# Patient Record
Sex: Male | Born: 1970 | Hispanic: No | Marital: Married | Smoking: Never smoker
Health system: Southern US, Community
[De-identification: ages and names within clinical notes are randomized; demographics above are authoritative.]

## PROBLEM LIST (undated history)

## (undated) VITALS — BP 118/80 | HR 80 | Ht 70.0 in | Wt 196.8 lb

## (undated) DIAGNOSIS — L501 Idiopathic urticaria: Secondary | ICD-10-CM

## (undated) DIAGNOSIS — R7301 Impaired fasting glucose: Secondary | ICD-10-CM

## (undated) DIAGNOSIS — E781 Pure hyperglyceridemia: Secondary | ICD-10-CM

## (undated) DIAGNOSIS — J309 Allergic rhinitis, unspecified: Secondary | ICD-10-CM

## (undated) HISTORY — DX: Allergic rhinitis, unspecified: J30.9

## (undated) HISTORY — DX: Idiopathic urticaria: L50.1

## (undated) HISTORY — DX: Impaired fasting glucose: R73.01

## (undated) HISTORY — DX: Pure hyperglyceridemia: E78.1

---

## 2004-08-12 HISTORY — PX: KNEE ARTHROSCOPY W/ MENISCAL REPAIR: SHX1877

## 2006-02-20 ENCOUNTER — Encounter: Payer: Self-pay | Admitting: Family Medicine

## 2006-02-20 LAB — CONVERTED CEMR LAB: TSH: 2.01 microintl units/mL

## 2006-12-17 ENCOUNTER — Encounter: Admission: RE | Admit: 2006-12-17 | Discharge: 2006-12-17 | Payer: Self-pay | Admitting: Family Medicine

## 2007-02-14 ENCOUNTER — Ambulatory Visit: Payer: Self-pay | Admitting: Vascular Surgery

## 2007-02-14 ENCOUNTER — Emergency Department (HOSPITAL_COMMUNITY): Admission: EM | Admit: 2007-02-14 | Discharge: 2007-02-14 | Payer: Self-pay | Admitting: Emergency Medicine

## 2007-02-14 ENCOUNTER — Encounter (INDEPENDENT_AMBULATORY_CARE_PROVIDER_SITE_OTHER): Payer: Self-pay | Admitting: Emergency Medicine
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
## 2009-08-02 ENCOUNTER — Encounter: Payer: Self-pay | Admitting: Family Medicine

## 2009-08-02 LAB — CONVERTED CEMR LAB
ALT: 23 units/L
AST: 21 units/L
Albumin: 4.5 g/dL
CO2: 28 meq/L
Calcium: 10.1 mg/dL
Chloride: 102 meq/L
Creatinine, Ser: 0.8 mg/dL
HCT: 41.2 %
HDL: 46 mg/dL
LDL Cholesterol: 104 mg/dL
Potassium: 4.8 meq/L
Total Protein: 7.3 g/dL
WBC: 5.4 10*3/uL

## 2010-08-10 ENCOUNTER — Ambulatory Visit: Payer: Self-pay | Admitting: Family Medicine

## 2010-08-10 DIAGNOSIS — E663 Overweight: Secondary | ICD-10-CM | POA: Insufficient documentation

## 2010-08-10 LAB — CONVERTED CEMR LAB
ALT: 44 units/L (ref 0–53)
AST: 29 units/L (ref 0–37)
Albumin: 4 g/dL (ref 3.5–5.2)
Alkaline Phosphatase: 57 units/L (ref 39–117)
Calcium: 9.3 mg/dL (ref 8.4–10.5)
Creatinine, Ser: 0.9 mg/dL (ref 0.4–1.5)
GFR calc non Af Amer: 105.17 mL/min (ref >60.00)
HDL: 35.6 mg/dL — ABNORMAL LOW (ref >39.00)
Sodium: 138 meq/L (ref 135–145)
Total Bilirubin: 0.6 mg/dL (ref 0.3–1.2)
Triglycerides: 193 mg/dL — ABNORMAL HIGH (ref 0.0–149.0)

## 2010-09-09 ENCOUNTER — Encounter: Payer: Self-pay | Admitting: Family Medicine

## 2010-09-13 NOTE — Assessment & Plan Note (Signed)
Summary: NEW PATIENT, EST / LFW   Vital Signs:  Patient profile:   40 year old male Height:      70 inches Weight:      196.75 pounds BMI:     28.33 Temp:     98.1 degrees F oral Pulse rate:   80 / minute Pulse rhythm:   regular BP sitting:   118 / 80  (left arm) Cuff size:   large  Vitals Entered By: Selena Batten Dance CMA Duncan Dull) (August 10, 2010 9:27 AM) CC: New patient to establish care   History of Present Illness: CC: CPE, new patient  last CPE was last year (05/2009).  Blood work done then, told cholesterol and sugar were fine.  Saw [facility].  Diet - good amt fruits and vegetables.  drinks water. Activity - treadmill 2-3x/wk for 30 min at home walking.  Preventative: flu shot - would like but we're out.  rec go to pharmacy Tetanus 2003. blood work was done last year.  thinks all normal.   Current Medications (verified): 1)  Centrum Ultra Mens  Tabs (Multiple Vitamins-Minerals) .... Take One Daily  Allergies (verified): No Known Drug Allergies  Past History:  Past Medical History: BMI 28  Past Surgical History: R meniscal tear repair 2006  Family History: M: HLD  No CA, CAD/MI, CVA, HTN, DM  Social History: No smoking, social EtOH, no rec drugs caffeine: 2 cups tea or coffee/day.  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    Physical Exam  General:  Well-developed,well-nourished,in no acute distress; alert,appropriate and cooperative throughout examination Head:  Normocephalic and atraumatic without  obvious abnormalities. No apparent alopecia or balding. Eyes:  No corneal or conjunctival inflammation noted. EOMI. Perrla. Ears:  TMs clear bilaterally Nose:  nares clear Mouth:  MMM, no pharyngeal erythema/edema/exudates Neck:  No deformities, masses, or tenderness noted.  no LAD Lungs:  Normal respiratory effort, chest expands symmetrically. Lungs are clear to auscultation, no crackles or wheezes. Heart:  Normal rate and regular rhythm. S1 and S2 normal without gallop, murmur, click, rub or other extra sounds. Abdomen:  Bowel sounds positive,abdomen soft and non-tender without masses, organomegaly or hernias noted. Msk:  No deformity or scoliosis noted of thoracic or lumbar spine.   Pulses:  2+ rad pulses, brisk cap refill Extremities:  no pedal edema Neurologic:  CN grossly intact, station and gait intact Skin:  Intact without suspicious lesions or rashes Psych:  full affect, pleasant and cooperative with exam   Impression & Recommendations:  Problem # 1:  HEALTH MAINTENANCE EXAM (ICD-V70.0) Reviewed preventive care protocols, scheduled due services, and updated immunizations.  utd tetanus.  request records from [place].  rec go to pharm to get flu shot.  dsicussed healthy eating and healthy living with increased exercise for overall health.  Orders: TLB-BMP (Basic Metabolic Panel-BMET) (80048-METABOL) TLB-Lipid Panel (80061-LIPID) TLB-Hepatic/Liver Function Pnl (80076-HEPATIC)  Problem # 2:  OVERWEIGHT (ICD-278.02) discussed ideal body weight  Ht: 70 (08/10/2010)   Wt: 196.75 (08/10/2010)   BMI: 28.33 (08/10/2010)  Complete Medication List: 1)  Centrum Ultra Mens Tabs (Multiple vitamins-minerals) .... Take one daily  Patient Instructions: 1)  Return in  1-2 years for next CPE or as needed. 2)  blood work today to check sugar and cholesterol. 3)  sign release of record from [place] (mainly for blood work there). 4)  good to meet you today.  call clinic with  questions.   Orders Added: 1)  TLB-BMP (Basic Metabolic Panel-BMET) [80048-METABOL] 2)  TLB-Lipid Panel [80061-LIPID] 3)  TLB-Hepatic/Liver Function Pnl [80076-HEPATIC] 4)  New Patient 18-39 years [99385]    Prior Medications: Current Allergies (reviewed today): No known allergies

## 2011-03-12 ENCOUNTER — Encounter: Payer: Self-pay | Admitting: Family Medicine

## 2011-03-13 ENCOUNTER — Ambulatory Visit (INDEPENDENT_AMBULATORY_CARE_PROVIDER_SITE_OTHER): Payer: 59 | Admitting: Family Medicine

## 2011-03-13 ENCOUNTER — Encounter: Payer: Self-pay | Admitting: Family Medicine

## 2011-03-13 VITALS — BP 122/78 | HR 72 | Temp 97.5°F | Ht 71.0 in | Wt 177.8 lb

## 2011-03-13 DIAGNOSIS — K1370 Unspecified lesions of oral mucosa: Secondary | ICD-10-CM | POA: Insufficient documentation

## 2011-03-13 NOTE — Patient Instructions (Addendum)
Look like apthous ulcer (canker sores) on tongue or could be herpes infection (virus responsible for cold sores). Symptomatic treatment for now - cold things like popsicles or warm things like herbal teas - continue dental hygiene brushing teeth 2x/day as well as floss.  listerine mouth wash.   Update [place] if fevers, joint pains, or worsening instead of improving. If not improving as expected, let me know and I could send in antiviral.

## 2011-03-13 NOTE — Assessment & Plan Note (Signed)
On tongue.  No systemic sxs. Going on 7+ days. 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   Treat supportively for now, if not improving, to call us for antiviral.

## 2011-03-13 NOTE — Progress Notes (Signed)
  Subjective:    Patient ID: Rowland Ericsson, male    DOB: [birth year], 40 y.o.   MRN: [id number]  HPI CC: blisters on tongue  1 wk h/o several blisters on tongue, one underneath tongue.  Somewhat painful.  Never had blisters like this before.  Has had cold sores in past.  Hasn't had any new medicines, foods, no recent dental procedures.  Endorsing salty taste in mouth.  No sick contacts at home.  No fevers/chills, other rashes, abd pain, nausea, vomiting.  No red eye, no joint pains.  Review of Systems Per HPI    Objective:   Physical Exam AFVSS NAD HEENT: PERRLA, EOMI, no conjunctival injection, MMM, old lesion right lateral tongue, healed.  New lesion left base of tongue, pustule with erythematous border, about 5mm diameter, tender      Assessment & Plan:

## 2011-08-23 ENCOUNTER — Ambulatory Visit (INDEPENDENT_AMBULATORY_CARE_PROVIDER_SITE_OTHER): Payer: 59 | Admitting: Family Medicine

## 2011-08-23 ENCOUNTER — Encounter: Payer: Self-pay | Admitting: Family Medicine

## 2011-08-23 ENCOUNTER — Telehealth: Payer: Self-pay | Admitting: Internal Medicine

## 2011-08-23 VITALS — BP 114/78 | HR 84 | Temp 99.1°F | Wt 180.0 lb

## 2011-08-23 DIAGNOSIS — J029 Acute pharyngitis, unspecified: Secondary | ICD-10-CM

## 2011-08-23 DIAGNOSIS — R509 Fever, unspecified: Secondary | ICD-10-CM | POA: Insufficient documentation

## 2011-08-23 LAB — POCT RAPID STREP A (OFFICE): Rapid Strep A Screen: NEGATIVE

## 2011-08-23 MED ORDER — ONDANSETRON HCL 4 MG PO TABS: 4.0000 mg | ORAL_TABLET | Freq: Three times a day (TID) | ORAL | Status: DC | PRN

## 2011-08-23 NOTE — Assessment & Plan Note (Addendum)
Likely viral, unlikely to be flu as he is well appearing and in nad, not sweaty.  Ddx d/w pt.  tamiflu unlikely to be of sig benefit.  He agrees.  Supportive tx, fluids and rest, zofran prn for nav.  Okay for outpatient f/u. F/u prn.  He agrees. I don't suspect a primary abdominal process given the total lack of tenderness on abd exam.

## 2011-08-23 NOTE — Patient Instructions (Signed)
Get some rest, take zofran for vomiting, drink sips of fluids.  This should gradually improve. Take tylenol for fever (most likely to be needed late afternoon, at night).

## 2011-08-23 NOTE — Progress Notes (Signed)
duration of symptoms: last 2 days Rhinorrhea: yes congestion:yes ear pain:no sore throat:yes Cough: yes, some sputum myalgias:minimal other concerns: fever 102.5 last night with some vomiting and shivering.  HA per patient, frontal Daughter has a cough.  Usually works from home.  No diarrhea.  No nausea now.   ROS: See HPI.  Otherwise negative.    Meds, vitals, and allergies reviewed.   GEN: nad, alert and oriented HEENT: mucous membranes moist, TM w/o erythema, nasal epithelium injected and stuffy, OP with cobblestoning but no exudates NECK: supple w/1 small and mildly tender node on R side, no other LA noted CV: rrr. PULM: ctab, no inc wob ABD: soft, +bs, no masses, not ttp EXT: no edema  RST neg

## 2011-08-29 ENCOUNTER — Ambulatory Visit (INDEPENDENT_AMBULATORY_CARE_PROVIDER_SITE_OTHER): Payer: 59 | Admitting: Family Medicine

## 2011-08-29 ENCOUNTER — Encounter: Payer: Self-pay | Admitting: Family Medicine

## 2011-08-29 VITALS — BP 102/82 | HR 69 | Temp 97.8°F | Wt 179.2 lb

## 2011-08-29 DIAGNOSIS — M62838 Other muscle spasm: Secondary | ICD-10-CM | POA: Insufficient documentation

## 2011-08-29 MED ORDER — CYCLOBENZAPRINE HCL 5 MG PO TABS: 5.0000 mg | ORAL_TABLET | Freq: Every evening | ORAL | Status: AC | PRN

## 2011-08-29 NOTE — Progress Notes (Signed)
  Subjective:    Patient ID: Trevor Hansen, male    DOB: 07-Dec-1970, 41 y.o.   MRN: [id number]  HPI  41 yo here for neck pain.  Had ILI last week, with body aches, fever and cough.  Those symptoms have all resolved but now has left sided neck pain that hurts most when he turns his head to the left side.  No nausea, no photophobia, has FROM without difficulty.  No fevers.  Patient Active Problem List  Diagnoses  . OVERWEIGHT  . Oral lesion  . Fever   No past medical history on file. Past Surgical History  Procedure Date  . Meniscal tear repair 2006    Right   History  Substance Use Topics  . Smoking status: Never Smoker   . Smokeless tobacco: Not on file  . Alcohol Use: Yes     Social   Family History  Problem Relation Age of Onset  . Hyperlipidemia Mother    No Known Allergies Current Outpatient Prescriptions on File Prior to Visit  Medication Sig Dispense Refill  . Multiple Vitamin (MULTIVITAMIN) tablet Take 1 tablet by mouth daily.         The PMH, PSH, Social History, Family History, Medications, and allergies have been reviewed in [facility], and have been updated if relevant.    Review of Systems See HPI     Objective:   Physical Exam BP 102/82  Pulse 69  Temp(Src) 97.8 F (36.6 C) (Oral)  Wt 179 lb 4 oz (81.307 kg) Gen:  Alert, pleasant, NAD HEENT:  FROM without difficulty, can touch chin to chest without any pain or difficulty. Pos left trap spasm Resp:  CTA bilaterally CVS:  RRR     Assessment & Plan:   1. Trapezius muscle spasm    New. Reassurance provided- he was concerned about meninigitis which he has no clinical signs and exam very reassuring. Advised heat and as needed nightly muscle relaxants. The patient indicates understanding of these issues and agrees with the plan.

## 2011-08-29 NOTE — Patient Instructions (Signed)
You have a muscle spasm likely from coughing and your recent flu illness. Keep applying heating pad, use muscle relaxants as directed.

## 2011-09-13 ENCOUNTER — Ambulatory Visit (INDEPENDENT_AMBULATORY_CARE_PROVIDER_SITE_OTHER): Payer: 59 | Admitting: Family Medicine

## 2011-09-13 ENCOUNTER — Encounter: Payer: Self-pay | Admitting: Family Medicine

## 2011-09-13 VITALS — BP 116/82 | HR 80 | Temp 98.3°F | Ht 71.0 in | Wt 181.0 lb

## 2011-09-13 DIAGNOSIS — E781 Pure hyperglyceridemia: Secondary | ICD-10-CM

## 2011-09-13 DIAGNOSIS — E785 Hyperlipidemia, unspecified: Secondary | ICD-10-CM | POA: Insufficient documentation

## 2011-09-13 DIAGNOSIS — Z1211 Encounter for screening for malignant neoplasm of colon: Secondary | ICD-10-CM | POA: Insufficient documentation

## 2011-09-13 DIAGNOSIS — R7301 Impaired fasting glucose: Secondary | ICD-10-CM | POA: Insufficient documentation

## 2011-09-13 DIAGNOSIS — Z0000 Encounter for general adult medical examination without abnormal findings: Secondary | ICD-10-CM | POA: Insufficient documentation

## 2011-09-13 DIAGNOSIS — Z0001 Encounter for general adult medical examination with abnormal findings: Secondary | ICD-10-CM | POA: Insufficient documentation

## 2011-09-13 DIAGNOSIS — Z23 Encounter for immunization: Secondary | ICD-10-CM

## 2011-09-13 LAB — LIPID PANEL
Cholesterol: 145 mg/dL (ref 0–200)
HDL: 39.8 mg/dL (ref >39.00)
LDL Cholesterol: 75 mg/dL (ref 0–99)
Triglycerides: 153 mg/dL — ABNORMAL HIGH (ref 0.0–149.0)
VLDL: 30.6 mg/dL (ref 0.0–40.0)

## 2011-09-13 LAB — BASIC METABOLIC PANEL
BUN: 13 mg/dL (ref 6–23)
Calcium: 9.1 mg/dL (ref 8.4–10.5)
Creatinine, Ser: 0.9 mg/dL (ref 0.4–1.5)
GFR: 104.58 mL/min (ref >60.00)
Glucose, Bld: 104 mg/dL — ABNORMAL HIGH (ref 70–99)
Potassium: 4.3 mEq/L (ref 3.5–5.1)

## 2011-09-13 LAB — HEMOGLOBIN A1C: Hgb A1c MFr Bld: 5.6 % (ref 4.6–6.5)

## 2011-09-13 NOTE — Progress Notes (Signed)
Addended by: Josph Macho A on: 09/13/2011 09:12 AM   Modules accepted: Orders

## 2011-09-13 NOTE — Assessment & Plan Note (Signed)
Chronic. Mild.  Discussed less sugar.

## 2011-09-13 NOTE — Assessment & Plan Note (Signed)
Check A1c today.  Discussed limiting carbs.

## 2011-09-13 NOTE — Assessment & Plan Note (Signed)
Reviewed preventative protocols and updated unless pt declined. Reviewed last year's blood work. This year check labs as fasting.

## 2011-09-13 NOTE — Patient Instructions (Addendum)
Good to see you today, call us with questions.  bloodwork today. Tdap today and flu shot today. Decrease added sugars, eliminate trans fats, increase fiber and limit alcohol.  All these changes together can drop triglycerides by almost 50%. Return in 1-2 years for next physical, sooner if needed.

## 2011-09-13 NOTE — Progress Notes (Addendum)
Subjective:    Patient ID: Trevor Hansen, Trevor Hansen    DOB: 1971-01-19, 41 y.o.   MRN: [id number]  HPI CC: CPE  No questions or concerns today. Wt Readings from Last 3 Encounters:  09/13/11 181 lb (82.101 kg)  08/29/11 179 lb 4 oz (81.307 kg)  08/23/11 180 lb (81.647 kg)  Body mass index is 25.24 kg/(m^2).  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 Tetanus 2003 - rpt today.  Tdap Reviewed last year's blood work, to get blood work today. Seat belt 100% time, sunscreen use discussed  Medications and allergies reviewed and updated in chart.  Past histories reviewed and updated if relevant as below. Patient Active Problem List  Diagnoses  . OVERWEIGHT  . Oral lesion  . Fever  . Trapezius muscle spasm  . Hypertriglyceridemia   Past Medical History  Diagnosis Date  . Hypertriglyceridemia     mild  . Elevated fasting blood sugar    Past Surgical History  Procedure Date  . Meniscal tear repair 2006    Right   History  Substance Use Topics  . Smoking status: Never Smoker   . Smokeless tobacco: Never Used  . Alcohol Use: Yes     Social   Family History  Problem Relation Age of Onset  . Hyperlipidemia Mother   . Coronary artery disease Maternal Aunt   . Coronary artery disease Maternal Uncle 62  . Cancer Maternal Grandmother     bone cancer  . Stroke Neg Hx   . Diabetes Neg Hx    No Known Allergies Current Outpatient Prescriptions on File Prior to Visit  Medication Sig Dispense Refill  . Multiple Vitamin (MULTIVITAMIN) tablet Take 1 tablet by mouth daily.          Review of Systems  Constitutional: Negative for fever, chills, activity change, appetite change, fatigue and unexpected weight  change.  HENT: Negative for hearing loss and neck pain.   Eyes: Negative for visual disturbance.  Respiratory: Negative for cough, chest tightness, shortness of breath and wheezing.   Cardiovascular: Negative for chest pain, palpitations and leg swelling.  Gastrointestinal: Negative for nausea, vomiting, abdominal pain, diarrhea, constipation, blood in stool and abdominal distention.  Genitourinary: Negative for hematuria and difficulty urinating.  Musculoskeletal: Negative for myalgias and arthralgias.  Skin: Negative for rash.  Neurological: Negative for dizziness, seizures, syncope and headaches.  Hematological: Does not bruise/bleed easily.  Psychiatric/Behavioral: Negative for dysphoric mood. The patient is not nervous/anxious.        Objective:   Physical Exam  Nursing note and vitals reviewed. Constitutional: He is oriented to person, place, and time. He appears well-developed and well-nourished. No distress.  HENT:  Head: Normocephalic and atraumatic.  Right Ear: External ear normal.  Left Ear: External ear normal.  Nose: Nose normal.  Mouth/Throat: Oropharynx is clear and moist. No oropharyngeal exudate.  Eyes: Conjunctivae and EOM are normal. Pupils are equal, round, and reactive to light. No scleral icterus.  Neck: Normal range of motion. Neck supple. No thyromegaly present.  Cardiovascular: Normal rate, regular rhythm, normal heart sounds and intact distal pulses.   No murmur heard. Pulses:      Radial pulses are 2+ on the right side, and 2+ on the left side.  Pulmonary/Chest:  Effort normal and breath sounds normal. No respiratory distress. He has no wheezes. He has no rales.  Abdominal: Soft. Bowel sounds are normal. He exhibits no distension and no mass. There is no tenderness. There is no rebound and no guarding.  Musculoskeletal: Normal range of motion.       Sl clubbing  Lymphadenopathy:    He has no cervical adenopathy.  Neurological: He is alert and oriented to  person, place, and time.       CN grossly intact, station and gait intact  Skin: Skin is warm and dry. No rash noted.  Psychiatric: He has a normal mood and affect. His behavior is normal. Judgment and thought content normal.      Assessment & Plan:

## 2011-12-05 ENCOUNTER — Other Ambulatory Visit: Payer: Self-pay

## 2011-12-05 MED ORDER — MOMETASONE FUROATE 50 MCG/ACT NA SUSP: NASAL | Status: DC

## 2011-12-05 NOTE — Telephone Encounter (Signed)
Pt request Nasonex sent to [facility]. Was not on med list but pt requested added because he uses prn. Pt said he forgot to mention when seen 09/13/11. Pt states runny nose,watery eyes, no fever and no cough.Pt can be reached [phone].

## 2011-12-05 NOTE — Telephone Encounter (Signed)
plz notify sent in.  See if has used flonase in past (in case PA needed)

## 2011-12-06 NOTE — Telephone Encounter (Signed)
Patient notified. He has used flonase before, but pick up Nasonex yesterday with no issues.

## 2013-02-01 ENCOUNTER — Encounter: Payer: Self-pay | Admitting: Family Medicine

## 2013-02-01 ENCOUNTER — Ambulatory Visit (INDEPENDENT_AMBULATORY_CARE_PROVIDER_SITE_OTHER): Payer: 59 | Admitting: Family Medicine

## 2013-02-01 VITALS — BP 112/70 | HR 80 | Temp 98.0°F | Wt 187.8 lb

## 2013-02-01 DIAGNOSIS — L509 Urticaria, unspecified: Secondary | ICD-10-CM | POA: Insufficient documentation

## 2013-02-01 NOTE — Progress Notes (Signed)
  Subjective:    Patient ID: Trevor Hansen, male    DOB: Mar 02, 1971, 42 y.o.   MRN: [id number]  CC: possible hives  HPI Patient presents with 2 episodes of hives that have occurred in the past two weeks. He noticed the rash last Monday and Thursday.  Both times the rash occurred around 2 am.   He states that it was itchy, but not sure if it spread when he scratched.  Took Zyrtec when the rash occurred, provided some relief.  After taking the Zyrtec, the rash stopped itching after about an hr and was completely gone when he woke up in the morning.  Has never had hives in the past and his only known allergy is pollen.  He has not started taking any new medications, using different detergents or soaps, and no new foods in the diet.  Does not have any pets at home.  Denies recent illness.   Review of Systems  Constitutional: Negative for fever, chills and diaphoresis.  HENT: Negative for facial swelling, mouth sores and trouble swallowing.   Respiratory: Negative for choking, chest tightness and shortness of breath.   Gastrointestinal: Negative for nausea, vomiting, abdominal pain and diarrhea.  Musculoskeletal: Negative for arthralgias.  Skin: Positive for rash (from trunk to knees).  Allergic/Immunologic: Positive for environmental allergies (pollen). Negative for food allergies and immunocompromised state.       Objective:   Physical Exam  Constitutional: He is oriented to person, place, and time. He appears well-developed and well-nourished. No distress.  HENT:  Head: Normocephalic.  Mouth/Throat: Oropharynx is clear and moist and mucous membranes are normal. No oropharyngeal exudate.  Eyes: Pupils are equal, round, and reactive to light.  Cardiovascular: Normal rate, regular rhythm and normal heart sounds.   Pulmonary/Chest: Effort normal and breath sounds normal. No respiratory distress.  Abdominal: Soft. Bowel sounds are normal. He exhibits no distension and no mass.  Neurological:  He is alert and oriented to person, place, and time.  Skin: Skin is warm. No rash noted. No erythema. No pallor.          Assessment & Plan:  Urticaria -keep a diary the next time this rash occurs and try to identify everything eaten or in contact with a few hours proceeding the rash -continue Zyrtec daily for the next 2 weeks, see if rash continues to occur even while taking the medication -call with an update 2 weeks from now or before if rash worsens or new symptoms develop  -consider referral to allergist if rash continues without an identifiable cause

## 2013-02-01 NOTE — Progress Notes (Signed)
Pt seen and examined with PA student Anna Genre  CC: hives?  Pt would like to review recent blood work from work health program - low glu at 60, elevated spgr otherwise normal.  Asked to scan.  Also presents with concern for possible hives for the last 2 weeks which occurs from lower abdomen to knees, spared arms, hands, feet, face.  Pruritic.  Not currently present.  Has had 2 separate episodes, both at 2am.  Has never had anything like this in past.  Once occurred after swimming.  Brings a picture on phone consistent with hives.  No exercise prior to second rash  So far has tried zyrtec which helps.   Rash occurs at 2am.   No new meds, no new detergents/soaps, no new foods.  No pets.  No bug bites. No recent viral infections, fevers/chills, chest tightness, SOB, cramping abd pain, nausea, tongue/lip swelling, arthralgias. Not heat or cold related.  Only allergy is pollen.  PE:  GEN: WDWN indian male NAD HEENT: MMM, no oral lesions CVS: nl S1/S2, no m/r/g Pulm: CTAB, no c/w Skin: no rash present today

## 2013-02-01 NOTE — Patient Instructions (Addendum)
Take zyrtec daily. Keep diary prior to hives. Update me on how your'e doing. We may refer to allergist for further evaluation if persistent or worsening.  Hives Hives are itchy, red, swollen areas of the skin. They can vary in size and location on your body. Hives can come and go for hours or several days (acute hives) or for several weeks (chronic hives). Hives do not spread from person to person (noncontagious). They may get worse with scratching, exercise, and emotional stress. CAUSES   Allergic reaction to food, additives, or drugs.  Infections, including the common cold.  Illness, such as vasculitis, lupus, or thyroid disease.  Exposure to sunlight, heat, or cold.  Exercise.  Stress.  Contact with chemicals. SYMPTOMS   Red or white swollen patches on the skin. The patches may change size, shape, and location quickly and repeatedly.  Itching.  Swelling of the hands, feet, and face. This may occur if hives develop deeper in the skin. DIAGNOSIS  Your caregiver can usually tell what is wrong by performing a physical exam. Skin or blood tests may also be done to determine the cause of your hives. In some cases, the cause cannot be determined. TREATMENT  Mild cases usually get better with medicines such as antihistamines. Severe cases may require an emergency epinephrine injection. If the cause of your hives is known, treatment includes avoiding that trigger.  HOME CARE INSTRUCTIONS   Avoid causes that trigger your hives.  Take antihistamines as directed by your caregiver to reduce the severity of your hives. Non-sedating or low-sedating antihistamines are usually recommended. Do not drive while taking an antihistamine.  Take any other medicines prescribed for itching as directed by your caregiver.  Wear loose-fitting clothing.  Keep all follow-up appointments as directed by your caregiver. SEEK MEDICAL CARE IF:   You have persistent or severe itching that is not relieved  with medicine.  You have painful or swollen joints. SEEK IMMEDIATE MEDICAL CARE IF:   You have a fever.  Your tongue or lips are swollen.  You have trouble breathing or swallowing.  You feel tightness in the throat or chest.  You have abdominal pain. These problems may be the first sign of a life-threatening allergic reaction. Call your local emergency services (911 in [place] MAKE SURE YOU:   Understand these instructions.  Will watch your condition.  Will get help right away if you are not doing well or get worse. Document Released: 07/29/2005 Document Revised: 01/28/2012 Document Reviewed: 10/22/2011 [facility] Patient Information 2014 [place], [place].

## 2013-02-01 NOTE — Assessment & Plan Note (Signed)
Unclear etiology - no triggers found. Recommend start zyrtec daily for 2 weeks and monitor for hives.   Keep diary a few hours prior to next episode to see if any consistent exposure triggering urticaria.

## 2013-02-04 ENCOUNTER — Encounter: Payer: Self-pay | Admitting: Family Medicine

## 2013-05-29 ENCOUNTER — Other Ambulatory Visit: Payer: Self-pay | Admitting: Family Medicine

## 2013-05-29 DIAGNOSIS — E781 Pure hyperglyceridemia: Secondary | ICD-10-CM

## 2013-05-29 DIAGNOSIS — R7301 Impaired fasting glucose: Secondary | ICD-10-CM

## 2013-06-01 ENCOUNTER — Other Ambulatory Visit (INDEPENDENT_AMBULATORY_CARE_PROVIDER_SITE_OTHER): Payer: 59

## 2013-06-01 DIAGNOSIS — E781 Pure hyperglyceridemia: Secondary | ICD-10-CM

## 2013-06-01 DIAGNOSIS — R7301 Impaired fasting glucose: Secondary | ICD-10-CM

## 2013-06-01 LAB — LIPID PANEL
Cholesterol: 146 mg/dL (ref 0–200)
LDL Cholesterol: 77 mg/dL (ref 0–99)
Triglycerides: 160 mg/dL — ABNORMAL HIGH (ref 0.0–149.0)

## 2013-06-01 LAB — BASIC METABOLIC PANEL
BUN: 13 mg/dL (ref 6–23)
Calcium: 9.3 mg/dL (ref 8.4–10.5)
Chloride: 103 mEq/L (ref 96–112)
Creatinine, Ser: 0.9 mg/dL (ref 0.4–1.5)

## 2013-06-10 ENCOUNTER — Encounter: Payer: Self-pay | Admitting: Family Medicine

## 2013-06-10 ENCOUNTER — Ambulatory Visit (INDEPENDENT_AMBULATORY_CARE_PROVIDER_SITE_OTHER): Payer: 59 | Admitting: Family Medicine

## 2013-06-10 VITALS — BP 130/84 | HR 91 | Temp 99.2°F | Ht 68.75 in | Wt 193.5 lb

## 2013-06-10 DIAGNOSIS — E663 Overweight: Secondary | ICD-10-CM | POA: Insufficient documentation

## 2013-06-10 DIAGNOSIS — R7301 Impaired fasting glucose: Secondary | ICD-10-CM

## 2013-06-10 DIAGNOSIS — Z0000 Encounter for general adult medical examination without abnormal findings: Secondary | ICD-10-CM

## 2013-06-10 DIAGNOSIS — E781 Pure hyperglyceridemia: Secondary | ICD-10-CM

## 2013-06-10 DIAGNOSIS — L509 Urticaria, unspecified: Secondary | ICD-10-CM

## 2013-06-10 DIAGNOSIS — Z23 Encounter for immunization: Secondary | ICD-10-CM

## 2013-06-10 DIAGNOSIS — R300 Dysuria: Secondary | ICD-10-CM | POA: Insufficient documentation

## 2013-06-10 LAB — POCT URINALYSIS DIPSTICK
Bilirubin, UA: NEGATIVE
Glucose, UA: NEGATIVE
Nitrite, UA: NEGATIVE
Spec Grav, UA: 1.01
Urobilinogen, UA: 0.2

## 2013-06-10 NOTE — Assessment & Plan Note (Signed)
Lab Results  Component Value Date   HGBA1C 5.6 09/13/2011  reviewed elevated sugar again - consider rechecking A1c if persistnetly elevated.

## 2013-06-10 NOTE — Assessment & Plan Note (Signed)
Preventative protocols reviewed and updated unless pt declined. Discussed healthy diet and lifestyle.  Weight gain noted - discussed

## 2013-06-10 NOTE — Assessment & Plan Note (Signed)
Minimal off meds - continue to monitor. 

## 2013-06-10 NOTE — Assessment & Plan Note (Signed)
Body mass index is 28.79 kg/(m^2).  Discussed weight gain noted over last year - encouraged him to increase regular exercise daily.

## 2013-06-10 NOTE — Progress Notes (Signed)
Subjective:    Patient ID: Trevor Hansen, male    DOB: 07/12/1971, 41 y.o.   MRN: [id number]  HPI CC: CPE  Having some issues over the last month. Persistent hives - zyrtec clears hives but then they return. Taking zyrtec QOD. Hives come on at night - wakes up itching - start in legs and progress to stomach. No known allergies to foods or medicines.  No change in detergent or shampoo or soaps. Not temperature related.  No new foods.  Over the last month noticing mild dysuria.  This happened for several months but not in the last week.  Noticing sharp R sided pain that lasts 2-3 minutes.   No frequency, urgency, hematuria.  No abd pain, fevers/chills, nausea.  Some fatigue with this.  Wt Readings from Last 3 Encounters:  06/10/13 193 lb 8 oz (87.771 kg)  02/01/13 187 lb 12 oz (85.163 kg)  09/13/11 181 lb (82.101 kg)    Drinking good water.  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  Past histories reviewed and updated if relevant as below. Patient Active Problem List   Diagnosis Date Noted  . Urticaria 02/01/2013  . Hypertriglyceridemia 09/13/2011  . Health maintenance examination 09/13/2011  . Impaired fasting blood sugar 09/13/2011   Past Medical History  Diagnosis Date  . Hypertriglyceridemia     mild  . Elevated fasting blood sugar    Past Surgical History  Procedure Laterality Date  . Meniscal tear repair  2006    Right   History  Substance Use Topics  . Smoking status: Never Smoker   . Smokeless tobacco: Never Used  . Alcohol Use: Yes     Comment: Social   Family  History  Problem Relation Age of Onset  . Hyperlipidemia Mother   . Coronary artery disease Maternal Aunt   . Coronary artery disease Maternal Uncle 62  . Cancer Maternal Grandmother     bone cancer  . Stroke Neg Hx   . Diabetes Neg Hx    No Known Allergies Current Outpatient Prescriptions on File Prior to Visit  Medication Sig Dispense Refill  . cetirizine (ZYRTEC) 10 MG tablet Take 10 mg by mouth daily as needed for allergies.      . mometasone (NASONEX) 50 MCG/ACT nasal spray One spray each nostril daily as needed.  17 g  6  . Multiple Vitamin (MULTIVITAMIN) tablet Take 1 tablet by mouth daily.         No current facility-administered medications on file prior to visit.     Review of Systems  Constitutional: Negative for fever, chills, activity change, appetite change, fatigue and unexpected weight change.  HENT: Negative for hearing loss.   Eyes: Negative for visual disturbance.  Respiratory: Negative for cough, chest tightness, shortness of breath and wheezing.   Cardiovascular: Negative for chest pain, palpitations and leg swelling.  Gastrointestinal: Negative for nausea, vomiting, abdominal pain, diarrhea, constipation, blood in stool and abdominal distention.  Genitourinary: Positive for dysuria (see HPI). Negative for hematuria and difficulty urinating.  Musculoskeletal: Negative for arthralgias, myalgias and neck pain.  Skin: Negative for rash.  Neurological:  Negative for dizziness, seizures, syncope and headaches.  Hematological: Negative for adenopathy. Does not bruise/bleed easily.  Psychiatric/Behavioral: Negative for dysphoric mood. The patient is not nervous/anxious.        Objective:   Physical Exam  Nursing note and vitals reviewed. Constitutional: He is oriented to person, place, and time. He appears well-developed and well-nourished. No distress.  HENT:  Head: Normocephalic and atraumatic.  Right Ear: Hearing, tympanic membrane, external ear and ear canal  normal.  Left Ear: Hearing, tympanic membrane, external ear and ear canal normal.  Nose: Nose normal.  Mouth/Throat: Oropharynx is clear and moist. No oropharyngeal exudate.  Eyes: Conjunctivae and EOM are normal. Pupils are equal, round, and reactive to light. No scleral icterus.  Neck: Normal range of motion. Neck supple. No thyromegaly present.  Cardiovascular: Normal rate, regular rhythm, normal heart sounds and intact distal pulses.   No murmur heard. Pulses:      Radial pulses are 2+ on the right side, and 2+ on the left side.  Pulmonary/Chest: Effort normal and breath sounds normal. No respiratory distress. He has no wheezes. He has no rales.  Abdominal: Soft. Bowel sounds are normal. He exhibits no distension and no mass. There is no tenderness. There is no rebound and no guarding.  Musculoskeletal: Normal range of motion. He exhibits no edema.  Lymphadenopathy:    He has no cervical adenopathy.  Neurological: He is alert and oriented to person, place, and time.  CN grossly intact, station and gait intact  Skin: Skin is warm and dry. No rash noted.  No hives. No dermatographism  Psychiatric: He has a normal mood and affect. His behavior is normal. Judgment and thought content normal.      Assessment & Plan:

## 2013-06-10 NOTE — Addendum Note (Signed)
Addended by: Shon Millet on: 06/10/2013 05:01 PM   Modules accepted: Orders

## 2013-06-10 NOTE — Addendum Note (Signed)
Addended by: Josph Macho A on: 06/10/2013 06:32 PM   Modules accepted: Orders

## 2013-06-10 NOTE — Assessment & Plan Note (Signed)
Anticipate chronic spontaneous urticaria - discussed. Encouraged daily zyrtec. Pt requests referral to allergist.  Placed today.

## 2013-06-10 NOTE — Patient Instructions (Signed)
Flu shot today. Urine checked today.  We will call you with results. Watch weight - as sugar was high today.  Avoid added sugars.  I'd like you to lose 10-15 lbs in the next year - restart walking/running program. Good to see you, call us with questions. Return in 1-2 years for next physical.

## 2013-06-10 NOTE — Assessment & Plan Note (Addendum)
check UA today.  Overall clear.  If persistent sxs, pt to return for recheck.  Encouraged increased water and avoiding bladder irritants like caffeine.

## 2013-06-29 ENCOUNTER — Encounter: Payer: Self-pay | Admitting: Family Medicine

## 2013-06-29 NOTE — Telephone Encounter (Signed)
Appt scheduled per EPIC.

## 2013-06-29 NOTE — Telephone Encounter (Signed)
Message left for patient to return my call.  

## 2013-06-29 NOTE — Telephone Encounter (Signed)
plz call pt to schedule appt for eval - tomorrow if able.

## 2013-07-01 ENCOUNTER — Encounter: Payer: Self-pay | Admitting: Family Medicine

## 2013-07-01 ENCOUNTER — Ambulatory Visit (INDEPENDENT_AMBULATORY_CARE_PROVIDER_SITE_OTHER): Payer: 59 | Admitting: Family Medicine

## 2013-07-01 VITALS — BP 112/70 | HR 72 | Temp 97.8°F | Wt 191.2 lb

## 2013-07-01 DIAGNOSIS — R300 Dysuria: Secondary | ICD-10-CM

## 2013-07-01 DIAGNOSIS — R319 Hematuria, unspecified: Secondary | ICD-10-CM

## 2013-07-01 LAB — POCT URINALYSIS DIPSTICK
Glucose, UA: NEGATIVE
Ketones, UA: NEGATIVE
Leukocytes, UA: NEGATIVE
Protein, UA: NEGATIVE
Urobilinogen, UA: NEGATIVE

## 2013-07-01 MED ORDER — SULFAMETHOXAZOLE-TMP DS 800-160 MG PO TABS: 1.0000 | ORAL_TABLET | Freq: Two times a day (BID) | ORAL | Status: DC

## 2013-07-01 NOTE — Progress Notes (Signed)
  Subjective:    Patient ID: Trevor Hansen, male    DOB: 05/15/1971, 42 y.o.   MRN: [id number]  HPI CC: painful urination and blood  4d ago when he voided after a bowel movement, felt sharp stabbing pain and later noticed blood on underwear.  Unsure if blood in commode as he flushed at that time.  Next void noticed small blood clot in urine.  Also noticing stronger odor in urine.   Occasional dysuria over last month.  Also with occasional sharp R sided discomfort that goes away within a few seconds.  Has increased water intake - ?frequency. No urgency. No fevers/chills, abd pain, back pain recently.  No nausea/vomiting.  No h/o kidney stones in past. No smokers at home. No fmhx bladder cancer. No printing fume exposure.  Past Medical History  Diagnosis Date  . Hypertriglyceridemia     mild  . Elevated fasting blood sugar     Family History  Problem Relation Age of Onset  . Hyperlipidemia Mother   . Coronary artery disease Maternal Aunt   . Coronary artery disease Maternal Uncle 62  . Cancer Maternal Grandmother     bone cancer  . Stroke Neg Hx   . Diabetes Neg Hx    Review of Systems Per HPI    Objective:   Physical Exam  Nursing note and vitals reviewed. Constitutional: He appears well-developed and well-nourished. No distress.  Abdominal: Soft. Normal appearance and bowel sounds are normal. He exhibits no distension and no mass. There is no hepatosplenomegaly. There is no tenderness. There is no rigidity, no rebound, no guarding, no CVA tenderness and negative Murphy's sign. Hernia confirmed negative in the right inguinal area and confirmed negative in the left inguinal area.  Genitourinary: Rectum normal, testes normal and penis normal. Rectal exam shows no external hemorrhoid, no internal hemorrhoid, no fissure, no mass, no tenderness and anal tone normal. Prostate is enlarged (25gm) and tender (mildly). Right testis shows no mass, no swelling and no tenderness. Right  testis is descended. Left testis shows no mass, no swelling and no tenderness. Left testis is descended. Uncircumcised. No discharge found.  Lymphadenopathy:       Right: No inguinal adenopathy present.       Left: No inguinal adenopathy present.       Assessment & Plan:

## 2013-07-01 NOTE — Progress Notes (Signed)
Pre-visit discussion using our clinic review tool. No additional management support is needed unless otherwise documented below in the visit note.  

## 2013-07-01 NOTE — Patient Instructions (Signed)
I think this may be a prostate infection. Treat with bactrim twice daily for 2 weeks. If persistent symptoms, let me know for further imaging. If persistent blood in urine, let me know as well. I'd like to recheck urine in 1-2 months (lab visit).  Prostatitis The prostate gland is about the size and shape of a walnut. It is located just below your bladder. It produces one of the components of semen, which is made up of sperm and the fluids that help nourish and transport it out from the testicles. Prostatitis is redness, soreness, and swelling (inflammation) of the prostate gland.  There are 3 types of prostatitis:  Acute bacterial prostatitis This is the least common type of prostatitis. It starts quickly and usually leads to a bladder infection. It can occur at any age.  Chronic bacterial prostatitis This is a persistent bacterial infection in the prostate. It usually develops from repeated acute bacterial prostatitis or acute bacterial prostatitis that was not properly treated. It can occur in men of any age but is most common in middle-aged men whose prostate has begun to enlarge.   Chronic prostatitis chronic pelvic pain syndrome This is the most common type of prostatitis. It is inflammation of the prostate gland that is not caused by a bacterial infection. The cause is unknown. CAUSES The cause of acute and chronic bacterial prostatitis is a bacterial infection. The exact cause of chronic prostatitis and chronic pelvic pain syndrome and asymptomatic inflammatory prostatitis is unknown.  SYMPTOMS  Symptoms can vary depending upon the type of prostatitis that exists. There can also be overlap in symptoms. Possible symptoms for each type of prostatitis are listed below. Acute bacterial prostatitis  Painful urination.  Fever or chills.  Muscle or joint pains.  Low back pain.  Low abdominal pain.  Inability to empty bladder completely.  Sudden urge to urinate.  Frequent  urination.  Difficulty starting urine stream.  Weak urine stream.  Discharge from the urethra.  Dribbling after urination.  Rectal pain.  Pain in the testicles, penis, or tip of the penis.  Pain in the space between the anus and scrotum (perineum).  Problems with sexual function.  Painful ejaculation.  Bloody semen. Chronic bacterial prostatitis  The symptoms are similar to those of acute bacterial prostatitis, but they usually are much less severe. Fever, chills, and muscle and joint pain are not associated with chronic bacterial prostatitis. Chronic prostatitis chronic pelvic pain syndrome  Symptoms typically include a dull ache in the scrotum and the perineum. DIAGNOSIS  In order to diagnose prostatitis, your caregiver will ask about your symptoms. If acute or chronic bacterial prostatitis is suspected, a urine sample will be taken and tested (urinalysis). This is to see if there is bacteria in your urine. If the urinalysis result is negative for bacteria, your caregiver may use a finger to feel your prostate (digital rectal exam). This exam helps your caregiver determine if your prostate is swollen and tender. TREATMENT  Treatment for prostatitis depends on the cause. If a bacterial infection is the cause, it can be treated with antibiotic medicine. In cases of chronic bacterial prostatitis, the use of antibiotics for up to 1 month may be necessary. Your caregiver may instruct you to take sitz baths to help relieve pain. A sitz bath is a bath of hot water in which your hips and buttocks are under water. HOME CARE INSTRUCTIONS   Take all medicines as directed by your caregiver.  Take sitz baths as directed by  your caregiver. SEEK MEDICAL CARE IF:   Your symptoms get worse, not better.  You have a fever. SEEK IMMEDIATE MEDICAL CARE IF:   You have chills.  You feel nauseous or vomit.  You feel lightheaded or faint.  You are unable to urinate.  You have blood or  blood clots in your urine. Document Released: 07/26/2000 Document Revised: 10/21/2011 Document Reviewed: 02/15/2013 [facility] Patient Information 2014 [place], [place].

## 2013-07-01 NOTE — Assessment & Plan Note (Addendum)
With boggy, mildly tender prostate on exam. Will treat as prostatitis with 2wk course of bactrim DS.  I did ask him to return for recheck UA in 1-2 months (labvisit) If persistent pain despite treatment, I asked him to notify me for further evaluation with imaging (consider CT scan w/o contrast hematuria protocol). If worsening, update me sooner.  Pt agrees with plan. I will send urine culture today.

## 2013-07-02 LAB — URINE CULTURE
Colony Count: NO GROWTH
Organism ID, Bacteria: NO GROWTH

## 2013-08-09 ENCOUNTER — Encounter: Payer: Self-pay | Admitting: Family Medicine

## 2014-02-09 ENCOUNTER — Ambulatory Visit (INDEPENDENT_AMBULATORY_CARE_PROVIDER_SITE_OTHER): Payer: 59 | Admitting: Family Medicine

## 2014-02-09 ENCOUNTER — Encounter: Payer: Self-pay | Admitting: Family Medicine

## 2014-02-09 VITALS — BP 118/80 | HR 80 | Temp 97.8°F | Wt 187.2 lb

## 2014-02-09 DIAGNOSIS — M25552 Pain in left hip: Secondary | ICD-10-CM | POA: Insufficient documentation

## 2014-02-09 DIAGNOSIS — M25559 Pain in unspecified hip: Secondary | ICD-10-CM

## 2014-02-09 DIAGNOSIS — R109 Unspecified abdominal pain: Secondary | ICD-10-CM

## 2014-02-09 LAB — POCT URINALYSIS DIPSTICK
BILIRUBIN UA: NEGATIVE
Blood, UA: NEGATIVE
GLUCOSE UA: NEGATIVE
KETONES UA: NEGATIVE
LEUKOCYTES UA: NEGATIVE
NITRITE UA: NEGATIVE
Protein, UA: NEGATIVE
Spec Grav, UA: >=1.03
Urobilinogen, UA: 0.2
pH, UA: 6

## 2014-02-09 NOTE — Progress Notes (Signed)
Pre visit review using our clinic review tool, if applicable. No additional management support is needed unless otherwise documented below in the visit note. 

## 2014-02-09 NOTE — Patient Instructions (Signed)
This is most consistent with musculoskeletal strain or bursitis. Treat with continued voltaren tablets, add on flexeril and ice to hip/back, and do stretching exercises provided today. If not improving, I recommend scheduling appointment with Dr Lorelei Pont or your orthopedist to discuss steroid injection into bursa.

## 2014-02-09 NOTE — Progress Notes (Signed)
   BP 118/80  Pulse 80  Temp(Src) 97.8 F (36.6 C) (Oral)  Wt 187 lb 4 oz (84.936 kg)   CC: flank pain  Subjective:    Patient ID: Trevor Hansen, male    DOB: [birth year], 43 y.o.   MRN: [id number]  HPI: Trevor Hansen is a 43 y.o. male presenting on 02/09/2014 for Flank Pain   5-10 d h/o L flank and L lateral hip pain. Occasionally sharp, occasionally dull ache. Worse after prolonged sitting or bending forward (positional). Denies inciting trauma or falls. Denies significant dysuria or urgency, no abd pain, nausea/vomiting, BM changes, diarrhea.  Saw orthopedist for back pain a few weeks ago, treated with diclofenac which is helping some.  H/o prostatitis treated with 2wk bactrim course 06/2013 and sxs resolved at that time No h/o kidney stones in past.  No smokers at home.  No fmhx bladder cancer.  No printing fume exposure.  Relevant past medical, surgical, family and social history reviewed and updated as indicated.  Allergies and medications reviewed and updated. Current Outpatient Prescriptions on File Prior to Visit  Medication Sig  . Multiple Vitamin (MULTIVITAMIN) tablet Take 1 tablet by mouth daily.    . cetirizine (ZYRTEC) 10 MG tablet Take 10 mg by mouth daily as needed for allergies.  . mometasone (NASONEX) 50 MCG/ACT nasal spray One spray each nostril daily as needed.   No current facility-administered medications on file prior to visit.    Review of Systems Per HPI unless specifically indicated above    Objective:    BP 118/80  Pulse 80  Temp(Src) 97.8 F (36.6 C) (Oral)  Wt 187 lb 4 oz (84.936 kg)  Physical Exam  Nursing note and vitals reviewed. Constitutional: He appears well-developed and well-nourished. No distress.  HENT:  Mouth/Throat: Oropharynx is clear and moist. No oropharyngeal exudate.  Cardiovascular: Normal rate, regular rhythm, normal heart sounds and intact distal pulses.   No murmur heard. Pulmonary/Chest: Effort normal and breath  sounds normal. No respiratory distress. He has no wheezes. He has no rales.  Abdominal: Soft. Normal appearance and bowel sounds are normal. He exhibits no distension and no mass. There is no hepatosplenomegaly. There is no tenderness. There is no rigidity, no rebound, no guarding, no CVA tenderness and negative Murphy's sign.  Musculoskeletal: He exhibits no edema.  No pain midline spine No paraspinous mm tenderness Neg SLR bilaterally. No pain with int/ext rotation at hip. Neg FABER. No pain at SIJ or sciatic notch bilaterally.  Tender at L GTB No pain with abduction/flexion of hip against resistance  Neurological:  5/5 strength BLE  Skin: Skin is warm and dry. No rash noted.       Assessment & Plan:   Problem List Items Addressed This Visit   Lateral pain of left hip     Most consistent with MSK cause like GTB on left. Already on voltaren tab bid. Will encourage regular flexeril use for next week, ice to lateral hip, and add trochanteric bursitis exercises from [facility] pt advisor. If not improving, suggested schedule appt with Dr. Lorelei Pont to discuss bursa injection. Pt agrees with plan. UA today WNL     Other Visit Diagnoses   Flank pain    -  Primary    Relevant Orders       POCT Urinalysis Dipstick        Follow up plan: Return if symptoms worsen or fail to improve.

## 2014-02-09 NOTE — Assessment & Plan Note (Addendum)
Most consistent with MSK cause like GTB on left. Already on voltaren tab bid. Will encourage regular flexeril use for next week, ice to lateral hip, and add trochanteric bursitis exercises from [facility] pt advisor. If not improving, suggested schedule appt with Dr. Lorelei Pont to discuss bursa injection. Pt agrees with plan. UA today WNL

## 2014-06-08 ENCOUNTER — Ambulatory Visit: Payer: 59

## 2014-06-09 ENCOUNTER — Ambulatory Visit (INDEPENDENT_AMBULATORY_CARE_PROVIDER_SITE_OTHER): Payer: 59

## 2014-06-09 DIAGNOSIS — Z23 Encounter for immunization: Secondary | ICD-10-CM

## 2014-07-27 ENCOUNTER — Other Ambulatory Visit: Payer: Self-pay | Admitting: Family Medicine

## 2014-07-27 DIAGNOSIS — R7301 Impaired fasting glucose: Secondary | ICD-10-CM

## 2014-07-27 DIAGNOSIS — E785 Hyperlipidemia, unspecified: Secondary | ICD-10-CM

## 2014-07-29 ENCOUNTER — Other Ambulatory Visit (INDEPENDENT_AMBULATORY_CARE_PROVIDER_SITE_OTHER): Payer: 59

## 2014-07-29 ENCOUNTER — Other Ambulatory Visit: Payer: Self-pay | Admitting: Family Medicine

## 2014-07-29 DIAGNOSIS — E785 Hyperlipidemia, unspecified: Secondary | ICD-10-CM

## 2014-07-29 DIAGNOSIS — R7301 Impaired fasting glucose: Secondary | ICD-10-CM

## 2014-07-29 LAB — LIPID PANEL
CHOLESTEROL: 158 mg/dL (ref 0–200)
HDL: 31.7 mg/dL — AB (ref >39.00)
LDL Cholesterol: 94 mg/dL (ref 0–99)
NonHDL: 126.3
TRIGLYCERIDES: 164 mg/dL — AB (ref 0.0–149.0)
Total CHOL/HDL Ratio: 5
VLDL: 32.8 mg/dL (ref 0.0–40.0)

## 2014-07-29 LAB — COMPREHENSIVE METABOLIC PANEL
ALT: 27 U/L (ref 0–53)
AST: 19 U/L (ref 0–37)
Albumin: 4.2 g/dL (ref 3.5–5.2)
Alkaline Phosphatase: 52 U/L (ref 39–117)
BILIRUBIN TOTAL: 0.7 mg/dL (ref 0.2–1.2)
BUN: 15 mg/dL (ref 6–23)
CALCIUM: 9.2 mg/dL (ref 8.4–10.5)
CHLORIDE: 103 meq/L (ref 96–112)
CO2: 26 meq/L (ref 19–32)
CREATININE: 0.9 mg/dL (ref 0.4–1.5)
GFR: 96.62 mL/min (ref >60.00)
GLUCOSE: 109 mg/dL — AB (ref 70–99)
Potassium: 4.1 mEq/L (ref 3.5–5.1)
Sodium: 134 mEq/L — ABNORMAL LOW (ref 135–145)
Total Protein: 7 g/dL (ref 6.0–8.3)

## 2014-07-29 LAB — CBC WITH DIFFERENTIAL/PLATELET
BASOS ABS: 0 10*3/uL (ref 0.0–0.1)
Basophils Relative: 0.5 % (ref 0.0–3.0)
EOS PCT: 4.8 % (ref 0.0–5.0)
Eosinophils Absolute: 0.3 10*3/uL (ref 0.0–0.7)
HEMATOCRIT: 41.2 % (ref 39.0–52.0)
HEMOGLOBIN: 13.8 g/dL (ref 13.0–17.0)
LYMPHS ABS: 1.6 10*3/uL (ref 0.7–4.0)
LYMPHS PCT: 29.5 % (ref 12.0–46.0)
MCHC: 33.6 g/dL (ref 30.0–36.0)
MCV: 88.2 fl (ref 78.0–100.0)
MONOS PCT: 6.7 % (ref 3.0–12.0)
Monocytes Absolute: 0.4 10*3/uL (ref 0.1–1.0)
NEUTROS ABS: 3.2 10*3/uL (ref 1.4–7.7)
Neutrophils Relative %: 58.5 % (ref 43.0–77.0)
PLATELETS: 257 10*3/uL (ref 150.0–400.0)
RBC: 4.67 Mil/uL (ref 4.22–5.81)
RDW: 12.8 % (ref 11.5–15.5)
WBC: 5.5 10*3/uL (ref 4.0–10.5)

## 2014-08-02 LAB — TSH: TSH: 4.308 u[IU]/mL (ref 0.350–4.500)

## 2014-08-09 ENCOUNTER — Ambulatory Visit (INDEPENDENT_AMBULATORY_CARE_PROVIDER_SITE_OTHER): Payer: 59 | Admitting: Family Medicine

## 2014-08-09 ENCOUNTER — Encounter: Payer: Self-pay | Admitting: Family Medicine

## 2014-08-09 VITALS — BP 114/78 | HR 80 | Temp 98.0°F | Ht 68.75 in | Wt 187.8 lb

## 2014-08-09 DIAGNOSIS — E785 Hyperlipidemia, unspecified: Secondary | ICD-10-CM

## 2014-08-09 DIAGNOSIS — L509 Urticaria, unspecified: Secondary | ICD-10-CM

## 2014-08-09 DIAGNOSIS — R7301 Impaired fasting glucose: Secondary | ICD-10-CM

## 2014-08-09 DIAGNOSIS — Z0000 Encounter for general adult medical examination without abnormal findings: Secondary | ICD-10-CM

## 2014-08-09 DIAGNOSIS — E663 Overweight: Secondary | ICD-10-CM

## 2014-08-09 NOTE — Assessment & Plan Note (Signed)
Reviewed sugars- check A1c next visit. Suggested looking into cinnamon to help regular sugar.

## 2014-08-09 NOTE — Assessment & Plan Note (Signed)
Body mass index is 27.94 kg/(m^2).

## 2014-08-09 NOTE — Progress Notes (Signed)
Pre visit review using our clinic review tool, if applicable. No additional management support is needed unless otherwise documented below in the visit note. 

## 2014-08-09 NOTE — Progress Notes (Signed)
BP 114/78 mmHg  Pulse 80  Temp(Src) 98 F (36.7 C) (Oral)  Ht 5' 8.75" (1.746 m)  Wt 187 lb 12 oz (85.163 kg)  BMI 27.94 kg/m2   CC: CPE Subjective:    Patient ID: Trevor Hansen, male    DOB: [birth year], 43 y.o.   MRN: [id number]  HPI: Trevor Hansen is a 43 y.o. 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 Interim medical history since our last visit reviewed. Allergies and medications reviewed and updated.  Current Outpatient Prescriptions on File Prior to Visit  Medication Sig  . Multiple Vitamin (MULTIVITAMIN) tablet Take 1 tablet by mouth daily.    . cetirizine (ZYRTEC) 10 MG tablet Take 10 mg by mouth daily as needed for allergies.  . cyclobenzaprine (FLEXERIL) 10 MG tablet Take 10 mg by mouth 3 (three) times daily as needed for muscle spasms.  . diclofenac (VOLTAREN) 75 MG EC tablet Take 75 mg by mouth 2 (two) times daily.  . mometasone (NASONEX) 50 MCG/ACT nasal spray One spray each nostril daily as needed. (Patient not taking: Reported on 08/09/2014)   No current facility-administered medications on file prior to visit.    Review of Systems  Constitutional: Negative for fever, chills, activity change, appetite change, fatigue and unexpected weight change.  HENT: Negative for hearing loss.        Noticing more mouth sores  Eyes: Negative for visual disturbance.  Respiratory: Negative for cough, chest tightness, shortness of breath and wheezing.   Cardiovascular: Negative for chest pain, palpitations and leg swelling.    Gastrointestinal: Negative for nausea, vomiting, abdominal pain, diarrhea, constipation, blood in stool and abdominal distention.  Genitourinary: Negative for hematuria and difficulty urinating.  Musculoskeletal: Negative for myalgias, arthralgias and neck pain.  Skin: Negative for rash.  Neurological: Negative for dizziness, seizures, syncope and headaches.  Hematological: Negative for adenopathy. Does not bruise/bleed easily.  Psychiatric/Behavioral: Negative for dysphoric mood. The patient is not nervous/anxious.    Per HPI unless specifically indicated above     Objective:    BP 114/78 mmHg  Pulse 80  Temp(Src) 98 F (36.7 C) (Oral)  Ht 5' 8.75" (1.746 m)  Wt 187 lb 12 oz (85.163 kg)  BMI 27.94 kg/m2  Wt Readings from Last 3 Encounters:  08/09/14 187 lb 12 oz (85.163 kg)  02/09/14 187 lb 4 oz (84.936 kg)  07/01/13 191 lb 4 oz (86.75 kg)    Physical Exam  Constitutional: He is oriented to person, place, and time. He appears well-developed and well-nourished. No distress.  HENT:  Head: Normocephalic and atraumatic.  Right Ear: Hearing, tympanic membrane, external ear and ear canal normal.  Left Ear: Hearing, tympanic membrane, external ear and ear canal normal.  Nose: Nose normal.  Mouth/Throat: Uvula is midline, oropharynx is clear and moist and mucous membranes are normal. No oropharyngeal exudate, posterior oropharyngeal edema or posterior oropharyngeal erythema.  Eyes: Conjunctivae and EOM are normal. Pupils are equal, round, and reactive to light. No scleral icterus.  Neck: Normal range of motion. Neck supple. No thyromegaly present.  Cardiovascular: Normal rate, regular  rhythm, normal heart sounds and intact distal pulses.   No murmur heard. Pulses:      Radial pulses are 2+ on the right side, and 2+ on the left side.  Pulmonary/Chest: Effort normal and breath sounds normal. No respiratory distress. He has no wheezes. He has no rales.  Abdominal: Soft. Bowel sounds  are normal. He exhibits no distension and no mass. There is no tenderness. There is no rebound and no guarding.  Musculoskeletal: Normal range of motion. He exhibits no edema.  Lymphadenopathy:    He has no cervical adenopathy.  Neurological: He is alert and oriented to person, place, and time.  CN grossly intact, station and gait intact  Skin: Skin is warm and dry. No rash noted.  Oral aphthous ulcers present L inner lip and R lateral tongue  Psychiatric: He has a normal mood and affect. His behavior is normal. Judgment and thought content normal.  Nursing note and vitals reviewed.  Results for orders placed or performed in visit on 07/29/14  Lipid panel  Result Value Ref Range   Cholesterol 158 0 - 200 mg/dL   Triglycerides 164.0 (H) 0.0 - 149.0 mg/dL   HDL 31.70 (L) >39.00 mg/dL   VLDL 32.8 0.0 - 40.0 mg/dL   LDL Cholesterol 94 0 - 99 mg/dL   Total CHOL/HDL Ratio 5    NonHDL 126.30   Comprehensive metabolic panel  Result Value Ref Range   Sodium 134 (L) 135 - 145 mEq/L   Potassium 4.1 3.5 - 5.1 mEq/L   Chloride 103 96 - 112 mEq/L   CO2 26 19 - 32 mEq/L   Glucose, Bld 109 (H) 70 - 99 mg/dL   BUN 15 6 - 23 mg/dL   Creatinine, Ser 0.9 0.4 - 1.5 mg/dL   Total Bilirubin 0.7 0.2 - 1.2 mg/dL   Alkaline Phosphatase 52 39 - 117 U/L   AST 19 0 - 37 U/L   ALT 27 0 - 53 U/L   Total Protein 7.0 6.0 - 8.3 g/dL   Albumin 4.2 3.5 - 5.2 g/dL   Calcium 9.2 8.4 - 10.5 mg/dL   GFR 96.62 >60.00 mL/min  CBC with Differential  Result Value Ref Range   WBC 5.5 4.0 - 10.5 K/uL   RBC 4.67 4.22 - 5.81 Mil/uL   Hemoglobin 13.8 13.0 - 17.0 g/dL   HCT 41.2 39.0 - 52.0 %   MCV 88.2 78.0 - 100.0 fl   MCHC 33.6 30.0 - 36.0 g/dL   RDW 12.8 11.5 - 15.5 %   Platelets 257.0 150.0 - 400.0 K/uL   Neutrophils Relative % 58.5 43.0 - 77.0 %   Lymphocytes Relative 29.5 12.0 - 46.0 %   Monocytes Relative 6.7 3.0 - 12.0 %   Eosinophils Relative 4.8 0.0 - 5.0 %   Basophils Relative 0.5 0.0 - 3.0 %   Neutro  Abs 3.2 1.4 - 7.7 K/uL   Lymphs Abs 1.6 0.7 - 4.0 K/uL   Monocytes Absolute 0.4 0.1 - 1.0 K/uL   Eosinophils Absolute 0.3 0.0 - 0.7 K/uL   Basophils Absolute 0.0 0.0 - 0.1 K/uL      Assessment & Plan:   Problem List Items Addressed This Visit    Urticaria    Stable period without need for antihistamine.    Overweight    Body mass index is 27.94 kg/(m^2).     Impaired fasting blood sugar    Reviewed sugars- check A1c next visit. Suggested looking into cinnamon to help  regular sugar.    Health maintenance examination - Primary    Preventative protocols reviewed and updated unless pt declined. Discussed healthy diet and lifestyle.    Dyslipidemia    Reviewed lifestyle/diet changes to help control #s.        Follow up plan: Return in about 1 year (around 08/10/2015), or as needed, for annual exam, prior fasting for blood work.

## 2014-08-09 NOTE — Assessment & Plan Note (Signed)
Preventative protocols reviewed and updated unless pt declined. Discussed healthy diet and lifestyle.  

## 2014-08-09 NOTE — Assessment & Plan Note (Signed)
Reviewed lifestyle/diet changes to help control #s.

## 2014-08-09 NOTE — Assessment & Plan Note (Signed)
Stable period without need for antihistamine.

## 2014-08-09 NOTE — Patient Instructions (Addendum)
I recommend 500-1084mcg B12 daily. Look into cinnamon - may help regulate sugars. Return as needed or on 1 year for next physical  Canker Sores  Canker sores are painful, open sores on the inside of the mouth and cheek. They may be white or yellow. The sores usually heal in 1 to 2 weeks. Women are more likely than men to have recurrent canker sores. CAUSES The cause of canker sores is not well understood. More than one cause is likely. Canker sores do not appear to be caused by certain types of germs (viruses or bacteria). Canker sores may be caused by:  An allergic reaction to certain foods.  Digestive problems.  Not having enough vitamin F09, folic acid, and iron.  Male sex hormones. Sores may come only during certain phases of a menstrual cycle. Often, there is improvement during pregnancy.  Genetics. Some people seem to inherit canker sore problems. Emotional stress and injuries to the mouth may trigger outbreaks, but not cause them.  DIAGNOSIS Canker sores are diagnosed by exam.  TREATMENT  Patients who have frequent bouts of canker sores may have cultures taken of the sores, blood tests, or allergy tests. This helps determine if their sores are caused by a poor diet, an allergy, or some other preventable or treatable disease.  Vitamins may prevent recurrences or reduce the severity of canker sores in people with poor nutrition.  Numbing ointments can relieve pain. These are available in drug stores without a prescription.  Anti-inflammatory steroid mouth rinses or gels may be prescribed by your caregiver for severe sores.  Oral steroids may be prescribed if you have severe, recurrent canker sores. These strong medicines can cause many side effects and should be used only under the close direction of a dentist or physician.  Mouth rinses containing the antibiotic medicine may be prescribed. They may lessen symptoms and speed healing. Healing usually happens in about 1 or 2  weeks with or without treatment. Certain antibiotic mouth rinses given to pregnant women and young children can permanently stain teeth. Talk to your caregiver about your treatment. HOME CARE INSTRUCTIONS   Avoid foods that cause canker sores for you.  Avoid citrus juices, spicy or salty foods, and coffee until the sores are healed.  Use a soft-bristled toothbrush.  Chew your food carefully to avoid biting your cheek.  Apply topical numbing medicine to the sore to help relieve pain.  Apply a thin paste of baking soda and water to the sore to help heal the sore.  Only use mouth rinses or medicines for pain or discomfort as directed by your caregiver. SEEK MEDICAL CARE IF:   Your symptoms are not better in 1 week.  Your sores are still present after 2 weeks.  Your sores are very painful.  You have trouble breathing or swallowing.  Your sores come back frequently. Document Released: 11/23/2010 Document Revised: 11/23/2012 Document Reviewed: 11/23/2010 [facility] Patient Information 2015 [place], [place]. This information is not intended to replace advice given to you by your health care provider. Make sure you discuss any questions you have with your health care provider.

## 2015-05-24 ENCOUNTER — Ambulatory Visit (INDEPENDENT_AMBULATORY_CARE_PROVIDER_SITE_OTHER): Payer: 59

## 2015-05-24 DIAGNOSIS — Z23 Encounter for immunization: Secondary | ICD-10-CM | POA: Diagnosis not present

## 2015-05-25 ENCOUNTER — Ambulatory Visit: Payer: Self-pay

## 2015-05-31 ENCOUNTER — Encounter: Payer: Self-pay | Admitting: Family Medicine

## 2015-08-14 ENCOUNTER — Other Ambulatory Visit: Payer: Self-pay | Admitting: Family Medicine

## 2015-08-14 DIAGNOSIS — E785 Hyperlipidemia, unspecified: Secondary | ICD-10-CM

## 2015-08-14 DIAGNOSIS — R7301 Impaired fasting glucose: Secondary | ICD-10-CM

## 2015-08-15 ENCOUNTER — Other Ambulatory Visit (INDEPENDENT_AMBULATORY_CARE_PROVIDER_SITE_OTHER): Payer: 59

## 2015-08-15 DIAGNOSIS — E785 Hyperlipidemia, unspecified: Secondary | ICD-10-CM

## 2015-08-15 DIAGNOSIS — R7301 Impaired fasting glucose: Secondary | ICD-10-CM | POA: Diagnosis not present

## 2015-08-15 LAB — BASIC METABOLIC PANEL
BUN: 13 mg/dL (ref 6–23)
CALCIUM: 9.5 mg/dL (ref 8.4–10.5)
CO2: 27 mEq/L (ref 19–32)
Chloride: 102 mEq/L (ref 96–112)
Creatinine, Ser: 0.88 mg/dL (ref 0.40–1.50)
GFR: 99.94 mL/min (ref >60.00)
GLUCOSE: 109 mg/dL — AB (ref 70–99)
POTASSIUM: 3.9 meq/L (ref 3.5–5.1)
SODIUM: 137 meq/L (ref 135–145)

## 2015-08-15 LAB — TSH: TSH: 3.32 u[IU]/mL (ref 0.35–4.50)

## 2015-08-15 LAB — LDL CHOLESTEROL, DIRECT: LDL DIRECT: 103 mg/dL

## 2015-08-15 LAB — LIPID PANEL
CHOL/HDL RATIO: 4
Cholesterol: 176 mg/dL (ref 0–200)
HDL: 39.6 mg/dL (ref >39.00)
NONHDL: 136.83
Triglycerides: 237 mg/dL — ABNORMAL HIGH (ref 0.0–149.0)
VLDL: 47.4 mg/dL — AB (ref 0.0–40.0)

## 2015-08-18 ENCOUNTER — Encounter: Payer: Self-pay | Admitting: Family Medicine

## 2015-08-18 ENCOUNTER — Ambulatory Visit (INDEPENDENT_AMBULATORY_CARE_PROVIDER_SITE_OTHER): Payer: 59 | Admitting: Family Medicine

## 2015-08-18 VITALS — BP 116/70 | HR 88 | Temp 98.1°F | Ht 68.75 in | Wt 186.0 lb

## 2015-08-18 DIAGNOSIS — R7301 Impaired fasting glucose: Secondary | ICD-10-CM

## 2015-08-18 DIAGNOSIS — N400 Enlarged prostate without lower urinary tract symptoms: Secondary | ICD-10-CM | POA: Insufficient documentation

## 2015-08-18 DIAGNOSIS — Z0000 Encounter for general adult medical examination without abnormal findings: Secondary | ICD-10-CM | POA: Diagnosis not present

## 2015-08-18 DIAGNOSIS — E785 Hyperlipidemia, unspecified: Secondary | ICD-10-CM

## 2015-08-18 MED ORDER — TAMSULOSIN HCL 0.4 MG PO CAPS: 0.4000 mg | ORAL_CAPSULE | Freq: Every day | ORAL | Status: DC

## 2015-08-18 NOTE — Assessment & Plan Note (Signed)
Predominantly elevated triglycerides. Discussed decreased added sugars and decrease in greasy/fatty meals. Discussed omega 3 fatty acids. Consider flax seed.

## 2015-08-18 NOTE — Assessment & Plan Note (Signed)
Reviewed with patient persistently elevated fasting sugar despite normal A1c. Lab Results  Component Value Date   HGBA1C 5.6 09/13/2011

## 2015-08-18 NOTE — Patient Instructions (Signed)
Try flomax for next few weeks -sent to pharmacy. You are doing well today. Keep an eye on triglycerides (decrease added sugars and fatty fried foods). Look into omega 3 fatty acids to help.  return in 1 year for next physical  Health Maintenance, Male A healthy lifestyle and preventative care can promote health and wellness.  Maintain regular health, dental, and eye exams.  Eat a healthy diet. Foods like vegetables, fruits, whole grains, low-fat dairy products, and lean protein foods contain the nutrients you need and are low in calories. Decrease your intake of foods high in solid fats, added sugars, and salt. Get information about a proper diet from your health care provider, if necessary.  Regular physical exercise is one of the most important things you can do for your health. Most adults should get at least 150 minutes of moderate-intensity exercise (any activity that increases your heart rate and causes you to sweat) each week. In addition, most adults need muscle-strengthening exercises on 2 or more days a week.   Maintain a healthy weight. The body mass index (BMI) is a screening tool to identify possible weight problems. It provides an estimate of body fat based on height and weight. Your health care provider can find your BMI and can help you achieve or maintain a healthy weight. For males 20 years and older:  A BMI below 18.5 is considered underweight.  A BMI of 18.5 to 24.9 is normal.  A BMI of 25 to 29.9 is considered overweight.  A BMI of 30 and above is considered obese.  Maintain normal blood lipids and cholesterol by exercising and minimizing your intake of saturated fat. Eat a balanced diet with plenty of fruits and vegetables. Blood tests for lipids and cholesterol should begin at age 25 and be repeated every 5 years. If your lipid or cholesterol levels are high, you are over age 103, or you are at high risk for heart disease, you may need your cholesterol levels checked  more frequently.Ongoing high lipid and cholesterol levels should be treated with medicines if diet and exercise are not working.  If you smoke, find out from your health care provider how to quit. If you do not use tobacco, do not start.  Lung cancer screening is recommended for adults aged 50-80 years who are at high risk for developing lung cancer because of a history of smoking. A yearly low-dose CT scan of the lungs is recommended for people who have at least a 30-pack-year history of smoking and are current smokers or have quit within the past 15 years. A pack year of smoking is smoking an average of 1 pack of cigarettes a day for 1 year (for example, a 30-pack-year history of smoking could mean smoking 1 pack a day for 30 years or 2 packs a day for 15 years). Yearly screening should continue until the smoker has stopped smoking for at least 15 years. Yearly screening should be stopped for people who develop a health problem that would prevent them from having lung cancer treatment.  If you choose to drink alcohol, do not have more than 2 drinks per day. One drink is considered to be 12 oz (360 mL) of beer, 5 oz (150 mL) of wine, or 1.5 oz (45 mL) of liquor.  Avoid the use of street drugs. Do not share needles with anyone. Ask for help if you need support or instructions about stopping the use of drugs.  High blood pressure causes heart disease and increases  the risk of stroke. High blood pressure is more likely to develop in:  People who have blood pressure in the end of the normal range (100-139/85-89 mm Hg).  People who are overweight or obese.  People who are African American.  If you are 93-36 years of age, have your blood pressure checked every 3-5 years. If you are 38 years of age or older, have your blood pressure checked every year. You should have your blood pressure measured twice--once when you are at a hospital or clinic, and once when you are not at a hospital or clinic. Record  the average of the two measurements. To check your blood pressure when you are not at a hospital or clinic, you can use:  An automated blood pressure machine at a pharmacy.  A home blood pressure monitor.  If you are 40-77 years old, ask your health care provider if you should take aspirin to prevent heart disease.  Diabetes screening involves taking a blood sample to check your fasting blood sugar level. This should be done once every 3 years after age 62 if you are at a normal weight and without risk factors for diabetes. Testing should be considered at a younger age or be carried out more frequently if you are overweight and have at least 1 risk factor for diabetes.  Colorectal cancer can be detected and often prevented. Most routine colorectal cancer screening begins at the age of 63 and continues through age 60. However, your health care provider may recommend screening at an earlier age if you have risk factors for colon cancer. On a yearly basis, your health care provider may provide home test kits to check for hidden blood in the stool. A small camera at the end of a tube may be used to directly examine the colon (sigmoidoscopy or colonoscopy) to detect the earliest forms of colorectal cancer. Talk to your health care provider about this at age 11 when routine screening begins. A direct exam of the colon should be repeated every 5-10 years through age 18, unless early forms of precancerous polyps or small growths are found.  People who are at an increased risk for hepatitis B should be screened for this virus. You are considered at high risk for hepatitis B if:  You were born in a country where hepatitis B occurs often. Talk with your health care provider about which countries are considered high risk.  Your parents were born in a high-risk country and you have not received a shot to protect against hepatitis B (hepatitis B vaccine).  You have HIV or AIDS.  You use needles to inject  street drugs.  You live with, or have sex with, someone who has hepatitis B.  You are a man who has sex with other men (MSM).  You get hemodialysis treatment.  You take certain medicines for conditions like cancer, organ transplantation, and autoimmune conditions.  Hepatitis C blood testing is recommended for all people born from [birth year] through 1965 and any individual with known risk factors for hepatitis C.  Healthy men should no longer receive prostate-specific antigen (PSA) blood tests as part of routine cancer screening. Talk to your health care provider about prostate cancer screening.  Testicular cancer screening is not recommended for adolescents or adult males who have no symptoms. Screening includes self-exam, a health care provider exam, and other screening tests. Consult with your health care provider about any symptoms you have or any concerns you have about testicular cancer.  Practice  safe sex. Use condoms and avoid high-risk sexual practices to reduce the spread of sexually transmitted infections (STIs).  You should be screened for STIs, including gonorrhea and chlamydia if:  You are sexually active and are younger than 24 years.  You are older than 24 years, and your health care provider tells you that you are at risk for this type of infection.  Your sexual activity has changed since you were last screened, and you are at an increased risk for chlamydia or gonorrhea. Ask your health care provider if you are at risk.  If you are at risk of being infected with HIV, it is recommended that you take a prescription medicine daily to prevent HIV infection. This is called pre-exposure prophylaxis (PrEP). You are considered at risk if:  You are a man who has sex with other men (MSM).  You are a heterosexual man who is sexually active with multiple partners.  You take drugs by injection.  You are sexually active with a partner who has HIV.  Talk with your health care provider  about whether you are at high risk of being infected with HIV. If you choose to begin PrEP, you should first be tested for HIV. You should then be tested every 3 months for as long as you are taking PrEP.  Use sunscreen. Apply sunscreen liberally and repeatedly throughout the day. You should seek shade when your shadow is shorter than you. Protect yourself by wearing long sleeves, pants, a wide-brimmed hat, and sunglasses year round whenever you are outdoors.  Tell your health care provider of new moles or changes in moles, especially if there is a change in shape or color. Also, tell your health care provider if a mole is larger than the size of a pencil eraser.  A one-time screening for abdominal aortic aneurysm (AAA) and surgical repair of large AAAs by ultrasound is recommended for men aged 52-75 years who are current or former smokers.  Stay current with your vaccines (immunizations).   This information is not intended to replace advice given to you by your health care provider. Make sure you discuss any questions you have with your health care provider.   Document Released: 01/25/2008 Document Revised: 08/19/2014 Document Reviewed: 12/24/2010 [employer] Interactive Patient Education [employer].

## 2015-08-18 NOTE — Progress Notes (Signed)
BP 116/70 mmHg  Pulse 88  Temp(Src) 98.1 F (36.7 C) (Oral)  Ht 5' 8.75" (1.746 m)  Wt 186 lb (84.369 kg)  BMI 27.68 kg/m2   CC: CPE  Subjective:    Patient ID: Trevor Hansen, male    DOB: Apr 28, 1971, 45 y.o.   MRN: [id number]  HPI: Trevor Hansen is a 45 y.o. male presenting on 08/18/2015 for Annual Exam   Urinary concerns - incomplete emptying with some dribbling. Stream intermittently weak. No dysuria, hematuria, fever, flank pain. No fmhx prostate trouble. No results found for: PSA  Preventative: flu shot - yearly Tdap 2013 Seat belt 100% time Sunscreen use discussed, no changing moles on skin.  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 Interim medical history since our last visit reviewed. Allergies and medications reviewed and updated. Current Outpatient Prescriptions on File Prior to Visit  Medication Sig  . cetirizine (ZYRTEC) 10 MG tablet Take 10 mg by mouth daily as needed for allergies.  . mometasone (NASONEX) 50 MCG/ACT nasal spray One spray each nostril daily as needed.  . Multiple Vitamin (MULTIVITAMIN) tablet Take 1 tablet by mouth daily.     No current facility-administered medications on file prior to visit.    Review of Systems  Constitutional: Negative for fever, chills, activity change, appetite change, fatigue and unexpected weight change.  HENT: Negative for hearing loss.   Eyes: Negative for visual disturbance.  Respiratory: Negative for cough, chest tightness, shortness of breath and wheezing.   Cardiovascular: Negative for chest pain, palpitations and leg swelling.  Gastrointestinal: Negative for nausea, vomiting, abdominal pain,  diarrhea, constipation, blood in stool and abdominal distention.  Genitourinary: Negative for hematuria and difficulty urinating.  Musculoskeletal: Negative for myalgias, arthralgias and neck pain.  Skin: Negative for rash.  Neurological: Negative for dizziness, seizures, syncope and headaches.  Hematological: Negative for adenopathy. Does not bruise/bleed easily.  Psychiatric/Behavioral: Negative for dysphoric mood. The patient is not nervous/anxious.    Per HPI unless specifically indicated in ROS section     Objective:    BP 116/70 mmHg  Pulse 88  Temp(Src) 98.1 F (36.7 C) (Oral)  Ht 5' 8.75" (1.746 m)  Wt 186 lb (84.369 kg)  BMI 27.68 kg/m2  Wt Readings from Last 3 Encounters:  08/18/15 186 lb (84.369 kg)  08/09/14 187 lb 12 oz (85.163 kg)  02/09/14 187 lb 4 oz (84.936 kg)    Physical Exam  Constitutional: He is oriented to person, place, and time. He appears well-developed and well-nourished. No distress.  HENT:  Head: Normocephalic and atraumatic.  Right Ear: Hearing, tympanic membrane, external ear and ear canal normal.  Left Ear: Hearing, tympanic membrane, external ear and ear canal normal.  Nose: Nose normal.  Mouth/Throat: Uvula is midline, oropharynx is clear and moist and mucous membranes are normal. No oropharyngeal exudate, posterior oropharyngeal edema or posterior oropharyngeal erythema.  Eyes: Conjunctivae and EOM are normal. Pupils are equal, round, and reactive to light. No scleral icterus.  Neck: Normal range of motion. Neck supple. No thyromegaly present.  Cardiovascular: Normal rate, regular rhythm, normal heart sounds and intact distal pulses.   No murmur heard. Pulses:      Radial pulses are 2+ on the  right side, and 2+ on the left side.  Pulmonary/Chest: Effort normal and breath sounds normal. No respiratory distress. He has no wheezes. He has no rales.  Abdominal: Soft. Bowel sounds are normal. He exhibits no distension and no mass. There is no  tenderness. There is no rebound and no guarding.  Genitourinary: Rectum normal. Rectal exam shows no external hemorrhoid, no internal hemorrhoid, no fissure, no mass, no tenderness and anal tone normal. Prostate is enlarged (mild, 25gm). Prostate is not tender.  Musculoskeletal: Normal range of motion. He exhibits no edema.  Lymphadenopathy:    He has no cervical adenopathy.  Neurological: He is alert and oriented to person, place, and time.  CN grossly intact, station and gait intact  Skin: Skin is warm and dry. No rash noted.  Psychiatric: He has a normal mood and affect. His behavior is normal. Judgment and thought content normal.  Nursing note and vitals reviewed.  Results for orders placed or performed in visit on 08/15/15  Lipid panel  Result Value Ref Range   Cholesterol 176 0 - 200 mg/dL   Triglycerides 237.0 (H) 0.0 - 149.0 mg/dL   HDL 39.60 >39.00 mg/dL   VLDL 47.4 (H) 0.0 - 40.0 mg/dL   Total CHOL/HDL Ratio 4    NonHDL 136.83   TSH  Result Value Ref Range   TSH 3.32 0.35 - 4.50 uIU/mL  Basic metabolic panel  Result Value Ref Range   Sodium 137 135 - 145 mEq/L   Potassium 3.9 3.5 - 5.1 mEq/L   Chloride 102 96 - 112 mEq/L   CO2 27 19 - 32 mEq/L   Glucose, Bld 109 (H) 70 - 99 mg/dL   BUN 13 6 - 23 mg/dL   Creatinine, Ser 0.88 0.40 - 1.50 mg/dL   Calcium 9.5 8.4 - 10.5 mg/dL   GFR 99.94 >60.00 mL/min  LDL cholesterol, direct  Result Value Ref Range   Direct LDL 103.0 mg/dL      Assessment & Plan:   Problem List Items Addressed This Visit    Impaired fasting blood sugar    Reviewed with patient persistently elevated fasting sugar despite normal A1c. Lab Results  Component Value Date   HGBA1C 5.6 09/13/2011        Health maintenance examination - Primary    Preventative protocols reviewed and updated unless pt declined. Discussed healthy diet and lifestyle.       Dyslipidemia    Predominantly elevated triglycerides. Discussed decreased added sugars and  decrease in greasy/fatty meals. Discussed omega 3 fatty acids. Consider flax seed.       BPH (benign prostatic hypertrophy)    Mild, with urinary symptoms. Will trial flomax. Consider super beta prostate or saw palmetto. Check PSA next lab visit.      Relevant Medications   tamsulosin (FLOMAX) 0.4 MG CAPS capsule       Follow up plan: Return in about 1 year (around 08/17/2016), or as needed, for annual exam, prior fasting for blood work.

## 2015-08-18 NOTE — Progress Notes (Signed)
Pre visit review using our clinic review tool, if applicable. No additional management support is needed unless otherwise documented below in the visit note. 

## 2015-08-18 NOTE — Assessment & Plan Note (Signed)
Preventative protocols reviewed and updated unless pt declined. Discussed healthy diet and lifestyle.  

## 2015-08-18 NOTE — Assessment & Plan Note (Signed)
Mild, with urinary symptoms. Will trial flomax. Consider super beta prostate or saw palmetto. Check PSA next lab visit.

## 2016-01-19 ENCOUNTER — Encounter: Payer: Self-pay | Admitting: Family Medicine

## 2016-03-21 NOTE — Progress Notes (Signed)
error 

## 2016-07-09 ENCOUNTER — Ambulatory Visit (INDEPENDENT_AMBULATORY_CARE_PROVIDER_SITE_OTHER): Payer: 59

## 2016-07-09 ENCOUNTER — Ambulatory Visit: Payer: 59

## 2016-07-09 DIAGNOSIS — Z23 Encounter for immunization: Secondary | ICD-10-CM

## 2017-01-23 ENCOUNTER — Telehealth: Payer: Self-pay | Admitting: Family Medicine

## 2017-01-23 NOTE — Telephone Encounter (Signed)
Pt dropped off form  To be filled out In dr g in box  Pt wanted climbing marked permanent

## 2017-01-24 ENCOUNTER — Encounter: Payer: Self-pay | Admitting: Family Medicine

## 2017-01-24 NOTE — Telephone Encounter (Signed)
Past Surgical History:  Procedure Laterality Date  . KNEE ARTHROSCOPY W/ MENISCAL REPAIR Right 2006   no heavy lifting >70lbs since then - work note filled out to this end (01/2016)    Form filled and in my out box.  If having ongoing knee pain recommend evaluation.  We will need to discuss this at next OV as I last saw patient 08/2015.

## 2017-01-27 NOTE — Telephone Encounter (Signed)
Left message on both numbers asking pt to call office Paperwork is ready.  Pt will need to turn paperwork in   Please see dr g note

## 2017-06-26 ENCOUNTER — Other Ambulatory Visit: Payer: Self-pay | Admitting: Family Medicine

## 2017-06-26 DIAGNOSIS — N400 Enlarged prostate without lower urinary tract symptoms: Secondary | ICD-10-CM

## 2017-06-26 DIAGNOSIS — E785 Hyperlipidemia, unspecified: Secondary | ICD-10-CM

## 2017-06-26 DIAGNOSIS — R7301 Impaired fasting glucose: Secondary | ICD-10-CM

## 2017-06-27 ENCOUNTER — Other Ambulatory Visit (INDEPENDENT_AMBULATORY_CARE_PROVIDER_SITE_OTHER): Payer: BLUE CROSS/BLUE SHIELD

## 2017-06-27 ENCOUNTER — Ambulatory Visit (INDEPENDENT_AMBULATORY_CARE_PROVIDER_SITE_OTHER): Payer: BLUE CROSS/BLUE SHIELD

## 2017-06-27 DIAGNOSIS — E785 Hyperlipidemia, unspecified: Secondary | ICD-10-CM | POA: Diagnosis not present

## 2017-06-27 DIAGNOSIS — R7301 Impaired fasting glucose: Secondary | ICD-10-CM | POA: Diagnosis not present

## 2017-06-27 DIAGNOSIS — Z23 Encounter for immunization: Secondary | ICD-10-CM

## 2017-06-27 DIAGNOSIS — N400 Enlarged prostate without lower urinary tract symptoms: Secondary | ICD-10-CM

## 2017-06-27 LAB — LIPID PANEL
CHOLESTEROL: 167 mg/dL (ref 0–200)
HDL: 41.3 mg/dL (ref >39.00)
LDL Cholesterol: 96 mg/dL (ref 0–99)
NONHDL: 126.17
Total CHOL/HDL Ratio: 4
Triglycerides: 149 mg/dL (ref 0.0–149.0)
VLDL: 29.8 mg/dL (ref 0.0–40.0)

## 2017-06-27 LAB — HEMOGLOBIN A1C: HEMOGLOBIN A1C: 5.7 % (ref 4.6–6.5)

## 2017-06-27 LAB — BASIC METABOLIC PANEL
BUN: 15 mg/dL (ref 6–23)
CHLORIDE: 103 meq/L (ref 96–112)
CO2: 27 mEq/L (ref 19–32)
CREATININE: 0.93 mg/dL (ref 0.40–1.50)
Calcium: 9.4 mg/dL (ref 8.4–10.5)
GFR: 92.98 mL/min (ref >60.00)
GLUCOSE: 117 mg/dL — AB (ref 70–99)
Potassium: 4.1 mEq/L (ref 3.5–5.1)
Sodium: 138 mEq/L (ref 135–145)

## 2017-06-27 LAB — PSA: PSA: 0.52 ng/mL (ref 0.10–4.00)

## 2017-06-27 LAB — TSH: TSH: 4.79 u[IU]/mL — ABNORMAL HIGH (ref 0.35–4.50)

## 2017-07-02 ENCOUNTER — Ambulatory Visit (INDEPENDENT_AMBULATORY_CARE_PROVIDER_SITE_OTHER): Payer: BLUE CROSS/BLUE SHIELD | Admitting: Family Medicine

## 2017-07-02 ENCOUNTER — Encounter: Payer: Self-pay | Admitting: Family Medicine

## 2017-07-02 VITALS — BP 120/70 | HR 78 | Temp 97.9°F | Ht 69.0 in | Wt 189.0 lb

## 2017-07-02 DIAGNOSIS — E785 Hyperlipidemia, unspecified: Secondary | ICD-10-CM

## 2017-07-02 DIAGNOSIS — R7301 Impaired fasting glucose: Secondary | ICD-10-CM

## 2017-07-02 DIAGNOSIS — R7989 Other specified abnormal findings of blood chemistry: Secondary | ICD-10-CM | POA: Diagnosis not present

## 2017-07-02 DIAGNOSIS — N400 Enlarged prostate without lower urinary tract symptoms: Secondary | ICD-10-CM | POA: Diagnosis not present

## 2017-07-02 DIAGNOSIS — Z0000 Encounter for general adult medical examination without abnormal findings: Secondary | ICD-10-CM

## 2017-07-02 NOTE — Assessment & Plan Note (Signed)
Endorses improved symptoms.

## 2017-07-02 NOTE — Patient Instructions (Addendum)
Thyroid levels returned slightly low - return in 4 months for lab visit only to recheck thyroid  You are doing well today. Return as needed or in 1 year for physical.   Health Maintenance, Male A healthy lifestyle and preventive care is important for your health and wellness. Ask your health care provider about what schedule of regular examinations is right for you. What should I know about weight and diet? Eat a Healthy Diet  Eat plenty of vegetables, fruits, whole grains, low-fat dairy products, and lean protein.  Do not eat a lot of foods high in solid fats, added sugars, or salt.  Maintain a Healthy Weight Regular exercise can help you achieve or maintain a healthy weight. You should:  Do at least 150 minutes of exercise each week. The exercise should increase your heart rate and make you sweat (moderate-intensity exercise).  Do strength-training exercises at least twice a week.  Watch Your Levels of Cholesterol and Blood Lipids  Have your blood tested for lipids and cholesterol every 5 years starting at 46 years of age. If you are at high risk for heart disease, you should start having your blood tested when you are 46 years old. You may need to have your cholesterol levels checked more often if: ? Your lipid or cholesterol levels are high. ? You are older than 46 years of age. ? You are at high risk for heart disease.  What should I know about cancer screening? Many types of cancers can be detected early and may often be prevented. Lung Cancer  You should be screened every year for lung cancer if: ? You are a current smoker who has smoked for at least 30 years. ? You are a former smoker who has quit within the past 15 years.  Talk to your health care provider about your screening options, when you should start screening, and how often you should be screened.  Colorectal Cancer  Routine colorectal cancer screening usually begins at 46 years of age and should be repeated  every 5-10 years until you are 46 years old. You may need to be screened more often if early forms of precancerous polyps or small growths are found. Your health care provider may recommend screening at an earlier age if you have risk factors for colon cancer.  Your health care provider may recommend using home test kits to check for hidden blood in the stool.  A small camera at the end of a tube can be used to examine your colon (sigmoidoscopy or colonoscopy). This checks for the earliest forms of colorectal cancer.  Prostate and Testicular Cancer  Depending on your age and overall health, your health care provider may do certain tests to screen for prostate and testicular cancer.  Talk to your health care provider about any symptoms or concerns you have about testicular or prostate cancer.  Skin Cancer  Check your skin from head to toe regularly.  Tell your health care provider about any new moles or changes in moles, especially if: ? There is a change in a mole's size, shape, or color. ? You have a mole that is larger than a pencil eraser.  Always use sunscreen. Apply sunscreen liberally and repeat throughout the day.  Protect yourself by wearing long sleeves, pants, a wide-brimmed hat, and sunglasses when outside.  What should I know about heart disease, diabetes, and high blood pressure?  If you are 35-21 years of age, have your blood pressure checked every 3-5 years. If  If you are 40 years of age or older, have your blood pressure checked every year. You should have your blood pressure measured twice-once when you are at a hospital or clinic, and once when you are not at a hospital or clinic. Record the average of the two measurements. To check your blood pressure when you are not at a hospital or clinic, you can use: ? An automated blood pressure machine at a pharmacy. ? A home blood pressure monitor.  Talk to your health care provider about your target blood pressure.  If you are  between 45-79 years old, ask your health care provider if you should take aspirin to prevent heart disease.  Have regular diabetes screenings by checking your fasting blood sugar level. ? If you are at a normal weight and have a low risk for diabetes, have this test once every three years after the age of 45. ? If you are overweight and have a high risk for diabetes, consider being tested at a younger age or more often.  A one-time screening for abdominal aortic aneurysm (AAA) by ultrasound is recommended for men aged 65-75 years who are current or former smokers. What should I know about preventing infection? Hepatitis B If you have a higher risk for hepatitis B, you should be screened for this virus. Talk with your health care provider to find out if you are at risk for hepatitis B infection. Hepatitis C Blood testing is recommended for:  Everyone born from 1945 through 1965.  Anyone with known risk factors for hepatitis C.  Sexually Transmitted Diseases (STDs)  You should be screened each year for STDs including gonorrhea and chlamydia if: ? You are sexually active and are younger than 46 years of age. ? You are older than 46 years of age and your health care provider tells you that you are at risk for this type of infection. ? Your sexual activity has changed since you were last screened and you are at an increased risk for chlamydia or gonorrhea. Ask your health care provider if you are at risk.  Talk with your health care provider about whether you are at high risk of being infected with HIV. Your health care provider may recommend a prescription medicine to help prevent HIV infection.  What else can I do?  Schedule regular health, dental, and eye exams.  Stay current with your vaccines (immunizations).  Do not use any tobacco products, such as cigarettes, chewing tobacco, and e-cigarettes. If you need help quitting, ask your health care provider.  Limit alcohol intake to no  more than 2 drinks per day. One drink equals 12 ounces of beer, 5 ounces of wine, or 1 ounces of hard liquor.  Do not use street drugs.  Do not share needles.  Ask your health care provider for help if you need support or information about quitting drugs.  Tell your health care provider if you often feel depressed.  Tell your health care provider if you have ever been abused or do not feel safe at home. This information is not intended to replace advice given to you by your health care provider. Make sure you discuss any questions you have with your health care provider. Document Released: 01/25/2008 Document Revised: 03/27/2016 Document Reviewed: 05/02/2015 Elsevier Interactive Patient Education  2018 Elsevier Inc[employer]  

## 2017-07-02 NOTE — Assessment & Plan Note (Signed)
Preventative protocols reviewed and updated unless pt declined. Discussed healthy diet and lifestyle.  

## 2017-07-02 NOTE — Assessment & Plan Note (Signed)
Marked improvement over the past year with change in diet (oatmeal for lunch). Congratulated.  The 10-year ASCVD risk score Mikey Bussing DC Brooke Bonito., et al., 2013) is: 1.7%   Values used to calculate the score:     Age: 46 years     Sex: Male     Is Non-Hispanic African American: No     Diabetic: No     Tobacco smoker: No     Systolic Blood Pressure: 778 mmHg     Is BP treated: No     HDL Cholesterol: 41.3 mg/dL     Total Cholesterol: 167 mg/dL

## 2017-07-02 NOTE — Progress Notes (Signed)
BP 120/70 (BP Location: Left Arm, Patient Position: Sitting, Cuff Size: Normal)   Pulse 78   Temp 97.9 F (36.6 C) (Oral)   Ht 5\' 9"  (1.753 m)   Wt 189 lb (85.7 kg)   SpO2 98%   BMI 27.91 kg/m    CC: CPE Subjective:    Patient ID: Trevor Hansen, male    DOB: 1971-08-03, 46 y.o.   MRN: [id number]  HPI: Trevor Hansen is a 46 y.o. male presenting on 07/02/2017 for Annual Exam   Voiding going well - did not need flomax.  Denies hypothyroid symptoms - some dry skin on feet. + fmhx thyroid disease (mother and brother both hypo).   Preventative: Flu shot yearly Tdap 2013 Seat belt use discussed Sunscreen use discussed, no changing moles on skin. 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 Interim medical history since our last visit reviewed. Allergies and medications reviewed and updated. Outpatient Medications Prior to Visit  Medication Sig Dispense Refill  . cetirizine (ZYRTEC) 10 MG tablet Take 10 mg by mouth daily as needed for allergies.    . mometasone (NASONEX) 50 MCG/ACT nasal spray One spray each nostril daily as needed. 17 g 6  . Multiple Vitamin (MULTIVITAMIN) tablet Take 1 tablet by mouth daily.      . tamsulosin (FLOMAX) 0.4 MG CAPS capsule Take 1 capsule (0.4 mg total) by mouth daily. 30 capsule 3   No facility-administered medications prior to visit.      Per HPI unless specifically indicated in ROS section below Review of Systems  Constitutional: Negative for activity change, appetite change, chills, fatigue, fever and unexpected weight change.  HENT: Negative for hearing loss.   Eyes: Negative for visual  disturbance.  Respiratory: Negative for cough, chest tightness, shortness of breath and wheezing.   Cardiovascular: Negative for chest pain, palpitations and leg swelling.  Gastrointestinal: Negative for abdominal distention, abdominal pain, blood in stool, constipation, diarrhea, nausea and vomiting.  Genitourinary: Negative for difficulty urinating and hematuria.  Musculoskeletal: Negative for arthralgias, myalgias and neck pain.  Skin: Negative for rash.  Neurological: Negative for dizziness, seizures, syncope and headaches.  Hematological: Negative for adenopathy. Does not bruise/bleed easily.  Psychiatric/Behavioral: Negative for dysphoric mood. The patient is not nervous/anxious.        Objective:    BP 120/70 (BP Location: Left Arm, Patient Position: Sitting, Cuff Size: Normal)   Pulse 78   Temp 97.9 F (36.6 C) (Oral)   Ht 5\' 9"  (1.753 m)   Wt 189 lb (85.7 kg)   SpO2 98%   BMI 27.91 kg/m   Wt Readings from Last 3 Encounters:  07/02/17 189 lb (85.7 kg)  08/18/15 186 lb (84.4 kg)  08/09/14 187 lb 12 oz (85.2 kg)    Physical Exam  Constitutional: He is oriented to person, place, and time. He appears well-developed and well-nourished. No distress.  HENT:  Head: Normocephalic and atraumatic.  Right Ear: Hearing, tympanic membrane, external ear and ear canal normal.  Left Ear: Hearing, tympanic membrane, external ear and ear canal normal.  Nose: Nose normal.  Mouth/Throat: Uvula is midline, oropharynx is clear and moist and mucous membranes are normal.  No oropharyngeal exudate, posterior oropharyngeal edema or posterior oropharyngeal erythema.  Eyes: Conjunctivae and EOM are normal. Pupils are equal, round, and reactive to light. No scleral icterus.  Neck: Normal range of motion. Neck supple. No thyromegaly present.  Cardiovascular: Normal rate, regular rhythm, normal heart sounds and intact distal pulses.  No murmur heard. Pulses:      Radial pulses are 2+ on the right  side, and 2+ on the left side.  Pulmonary/Chest: Effort normal and breath sounds normal. No respiratory distress. He has no wheezes. He has no rales.  Abdominal: Soft. Bowel sounds are normal. He exhibits no distension and no mass. There is no tenderness. There is no rebound and no guarding.  Musculoskeletal: Normal range of motion. He exhibits no edema.  Lymphadenopathy:    He has no cervical adenopathy.  Neurological: He is alert and oriented to person, place, and time.  CN grossly intact, station and gait intact  Skin: Skin is warm and dry. No rash noted.  Psychiatric: He has a normal mood and affect. His behavior is normal. Judgment and thought content normal.  Nursing note and vitals reviewed.  Results for orders placed or performed in visit on 06/27/17  Hemoglobin A1c  Result Value Ref Range   Hgb A1c MFr Bld 5.7 4.6 - 6.5 %  PSA  Result Value Ref Range   PSA 0.52 0.10 - 4.00 ng/mL  TSH  Result Value Ref Range   TSH 4.79 (H) 0.35 - 4.50 uIU/mL  Basic metabolic panel  Result Value Ref Range   Sodium 138 135 - 145 mEq/L   Potassium 4.1 3.5 - 5.1 mEq/L   Chloride 103 96 - 112 mEq/L   CO2 27 19 - 32 mEq/L   Glucose, Bld 117 (H) 70 - 99 mg/dL   BUN 15 6 - 23 mg/dL   Creatinine, Ser 0.93 0.40 - 1.50 mg/dL   Calcium 9.4 8.4 - 10.5 mg/dL   GFR 92.98 >60.00 mL/min  Lipid panel  Result Value Ref Range   Cholesterol 167 0 - 200 mg/dL   Triglycerides 149.0 0.0 - 149.0 mg/dL   HDL 41.30 >39.00 mg/dL   VLDL 29.8 0.0 - 40.0 mg/dL   LDL Cholesterol 96 0 - 99 mg/dL   Total CHOL/HDL Ratio 4    NonHDL 126.17       Assessment & Plan:   Problem List Items Addressed This Visit    Abnormal TSH    Denies hypothyroid symptoms. He does have fmhx thyroid disease. I asked him to return in 4 mo for lab visit to recheck levels.       Relevant Orders   TSH   T3   T4, free   Benign prostatic hyperplasia    Endorses improved symptoms.       Dyslipidemia    Marked improvement over the  past year with change in diet (oatmeal for lunch). Congratulated.  The 10-year ASCVD risk score Mikey Bussing DC Brooke Bonito., et al., 2013) is: 1.7%   Values used to calculate the score:     Age: 70 years     Sex: Male     Is Non-Hispanic African American: No     Diabetic: No     Tobacco smoker: No     Systolic Blood Pressure: 938 mmHg     Is BP treated: No     HDL Cholesterol: 41.3 mg/dL     Total Cholesterol: 167 mg/dL       Health maintenance examination - Primary  Preventative protocols reviewed and updated unless pt declined. Discussed healthy diet and lifestyle.       Impaired fasting blood sugar    Encouraged low carb diet.           Follow up plan: Return in about 1 year (around 07/02/2018) for annual exam, prior fasting for blood work.  Ria Bush, MD

## 2017-07-02 NOTE — Assessment & Plan Note (Signed)
Encouraged low carb diet 

## 2017-07-02 NOTE — Assessment & Plan Note (Addendum)
Denies hypothyroid symptoms. He does have fmhx thyroid disease. I asked him to return in 4 mo for lab visit to recheck levels.

## 2017-10-30 ENCOUNTER — Other Ambulatory Visit (INDEPENDENT_AMBULATORY_CARE_PROVIDER_SITE_OTHER): Payer: BLUE CROSS/BLUE SHIELD

## 2017-10-30 DIAGNOSIS — R7989 Other specified abnormal findings of blood chemistry: Secondary | ICD-10-CM

## 2017-10-30 LAB — TSH: TSH: 2.55 u[IU]/mL (ref 0.35–4.50)

## 2017-10-30 LAB — T4, FREE: Free T4: 0.88 ng/dL (ref 0.60–1.60)

## 2017-10-31 LAB — T3: T3 TOTAL: 114 ng/dL (ref 76–181)

## 2018-02-03 ENCOUNTER — Encounter: Payer: Self-pay | Admitting: Family Medicine

## 2018-02-03 ENCOUNTER — Ambulatory Visit (INDEPENDENT_AMBULATORY_CARE_PROVIDER_SITE_OTHER): Payer: BLUE CROSS/BLUE SHIELD | Admitting: Family Medicine

## 2018-02-03 VITALS — BP 118/80 | HR 71 | Temp 97.9°F | Ht 69.0 in | Wt 189.2 lb

## 2018-02-03 DIAGNOSIS — R109 Unspecified abdominal pain: Secondary | ICD-10-CM | POA: Insufficient documentation

## 2018-02-03 LAB — POC URINALSYSI DIPSTICK (AUTOMATED)
Bilirubin, UA: NEGATIVE
Glucose, UA: NEGATIVE
KETONES UA: NEGATIVE
Leukocytes, UA: NEGATIVE
Nitrite, UA: NEGATIVE
PROTEIN UA: NEGATIVE
RBC UA: NEGATIVE
SPEC GRAV UA: 1.015 (ref 1.010–1.025)
UROBILINOGEN UA: 0.2 U/dL
pH, UA: 6 (ref 5.0–8.0)

## 2018-02-03 NOTE — Progress Notes (Signed)
BP 118/80 (BP Location: Left Arm, Patient Position: Sitting, Cuff Size: Normal)   Pulse 71   Temp 97.9 F (36.6 C) (Oral)   Ht 5\' 9"  (1.753 m)   Wt 189 lb 4 oz (85.8 kg)   SpO2 98%   BMI 27.95 kg/m    CC: RLQ abd pain Subjective:    Patient ID: Trevor Hansen, male    DOB: 10-Mar-1971, 47 y.o.   MRN: [id number]  HPI: Trevor Hansen is a 47 y.o. male presenting on 02/03/2018 for Abdominal Pain (C/o RLQ abd pain for about 1 wk. Pain is sharp when bending to right side. Denies fever, N/V/D.)   1 wk h/o intermittent R sided abd discomfort more noticeable with bending over or when sleeping on left side. 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 Describes sharp pain that quickly goes away if he changes position.  No changes in activity routine.  Hasn't tried anything for this. Motrin didn't really help.   No fevers/chills, appetite changes, nausea/vomiting, diarrhea/constipation, blood in stool, cough, urinary symptoms like dysuria or hematuria.   Relevant past medical, surgical, family and social history reviewed and updated as indicated. Interim medical history since our last visit reviewed. Allergies and medications reviewed and updated. Outpatient Medications Prior to Visit  Medication Sig Dispense Refill  . cetirizine (ZYRTEC) 10 MG tablet Take 10 mg by mouth daily as needed for allergies.    . mometasone (NASONEX) 50 MCG/ACT nasal spray One spray each nostril daily as needed. 17 g 6  . Multiple Vitamin (MULTIVITAMIN) tablet Take 1 tablet by mouth daily.       No facility-administered medications prior to visit.      Per HPI unless specifically indicated in ROS section below Review of Systems     Objective:    BP 118/80 (BP Location: Left Arm, Patient Position: Sitting, Cuff Size: Normal)   Pulse 71   Temp 97.9 F (36.6 C) (Oral)   Ht 5\' 9"  (1.753 m)   Wt 189 lb 4 oz (85.8 kg)   SpO2 98%   BMI 27.95 kg/m   Wt Readings from Last 3 Encounters:  02/03/18 189 lb 4 oz (85.8  kg)  07/02/17 189 lb (85.7 kg)  08/18/15 186 lb (84.4 kg)    Physical Exam  Constitutional: He appears well-developed and well-nourished. He does not appear ill. No distress.  HENT:  Head: Normocephalic and atraumatic.  Mouth/Throat: Oropharynx is clear and moist. No oropharyngeal exudate.  Cardiovascular: Normal rate, regular rhythm and normal heart sounds.  No murmur heard. Pulmonary/Chest: Effort normal and breath sounds normal. No respiratory distress. He has no wheezes. He has no rhonchi. He has no rales.  Abdominal: Soft. Normal appearance, normal aorta and bowel sounds are normal. There is no hepatosplenomegaly. There is no tenderness. There is no rebound, no guarding, no CVA tenderness and negative Murphy's sign. Hernia confirmed negative in the ventral area.  Nursing note and vitals reviewed.  Results for orders placed or performed in visit on 02/03/18  POCT Urinalysis Dipstick (Automated)  Result Value Ref Range   Color, UA straw    Clarity, UA clear    Glucose, UA Negative Negative   Bilirubin, UA negative    Ketones, UA negative    Spec Grav, UA 1.015 1.010 - 1.025   Blood, UA negative    pH, UA 6.0 5.0 - 8.0   Protein, UA Negative Negative   Urobilinogen, UA 0.2 0.2 or 1.0 E.U./dL   Nitrite, UA negative  Leukocytes, UA Negative Negative      Assessment & Plan:   Problem List Items Addressed This Visit    Right sided abdominal pain - Primary    Positional - sounds very musculoskeletal - anticipate abd wall muscle strain. Reviewed with patient, reassured. Check UA to r/o renal cause. rec treat with tylenol, motrin, heating pad, gentle stretching of abdominal wall muscles, update if not improving with treatment. Pt agrees with plan.       Relevant Orders   POCT Urinalysis Dipstick (Automated) (Completed)       No orders of the defined types were placed in this encounter.  Orders Placed This Encounter  Procedures  . POCT Urinalysis Dipstick (Automated)     Follow up plan: No follow-ups on file.  Ria Bush, MD

## 2018-02-03 NOTE — Patient Instructions (Signed)
Urinalysis today I think this is musculoskeletal pain that should improve over time - strained abdominal wall muscle. Treat with heating pad to area, motrin as needed, gentle stretching. Let us know if not getting better.

## 2018-02-03 NOTE — Assessment & Plan Note (Signed)
Positional - sounds very musculoskeletal - anticipate abd wall muscle strain. Reviewed with patient, reassured. Check UA to r/o renal cause. rec treat with tylenol, motrin, heating pad, gentle stretching of abdominal wall muscles, update if not improving with treatment. Pt agrees with plan.

## 2018-07-23 ENCOUNTER — Ambulatory Visit (INDEPENDENT_AMBULATORY_CARE_PROVIDER_SITE_OTHER): Payer: BLUE CROSS/BLUE SHIELD

## 2018-07-23 DIAGNOSIS — Z23 Encounter for immunization: Secondary | ICD-10-CM

## 2019-06-10 ENCOUNTER — Ambulatory Visit: Payer: BLUE CROSS/BLUE SHIELD

## 2019-06-10 ENCOUNTER — Ambulatory Visit (INDEPENDENT_AMBULATORY_CARE_PROVIDER_SITE_OTHER): Payer: BC Managed Care – PPO

## 2019-06-10 DIAGNOSIS — Z23 Encounter for immunization: Secondary | ICD-10-CM | POA: Diagnosis not present

## 2019-11-12 ENCOUNTER — Ambulatory Visit: Payer: BC Managed Care – PPO | Attending: Internal Medicine

## 2019-11-12 DIAGNOSIS — Z23 Encounter for immunization: Secondary | ICD-10-CM

## 2019-11-12 NOTE — Progress Notes (Signed)
   Covid-19 Vaccination Clinic  Name:  Trevor Hansen    MRN: [id number] DOB: 1971/06/09  11/12/2019  Trevor Hansen was observed post Covid-19 immunization for 15 minutes without incident. He was provided with Vaccine Information Sheet and instruction to access the V-Safe system.   Trevor Hansen was instructed to call 911 with any severe reactions post vaccine: Marland Kitchen Difficulty breathing  . Swelling of face and throat  . A fast heartbeat  . A bad rash all over body  . Dizziness and weakness   Immunizations Administered    Name Date Dose VIS Date Route   Pfizer COVID-19 Vaccine 11/12/2019  8:52 AM 0.3 mL 07/23/2019 Intramuscular   Manufacturer: [place]   Lot: [id number]   [place]: [id number]

## 2019-12-07 ENCOUNTER — Ambulatory Visit: Payer: BC Managed Care – PPO | Attending: Internal Medicine

## 2019-12-07 DIAGNOSIS — Z23 Encounter for immunization: Secondary | ICD-10-CM

## 2019-12-07 NOTE — Progress Notes (Signed)
   Covid-19 Vaccination Clinic  Name:  Shea Duro    MRN: [id number] DOB: 05/25/1971  12/07/2019  Mr. Tippens was observed post Covid-19 immunization for 15 minutes without incident. He was provided with Vaccine Information Sheet and instruction to access the V-Safe system.   Mr. Anglade was instructed to call 911 with any severe reactions post vaccine: Marland Kitchen Difficulty breathing  . Swelling of face and throat  . A fast heartbeat  . A bad rash all over body  . Dizziness and weakness   Immunizations Administered    Name Date Dose VIS Date Route   Pfizer COVID-19 Vaccine 12/07/2019 10:42 AM 0.3 mL 10/06/2018 Intramuscular   Manufacturer: [place]   Lot: [id number]   [place]: [id number]

## 2019-12-18 ENCOUNTER — Ambulatory Visit: Payer: BC Managed Care – PPO

## 2020-01-11 ENCOUNTER — Other Ambulatory Visit: Payer: Self-pay | Admitting: Family Medicine

## 2020-01-11 DIAGNOSIS — R7989 Other specified abnormal findings of blood chemistry: Secondary | ICD-10-CM

## 2020-01-11 DIAGNOSIS — R7303 Prediabetes: Secondary | ICD-10-CM

## 2020-01-11 DIAGNOSIS — E785 Hyperlipidemia, unspecified: Secondary | ICD-10-CM

## 2020-01-11 DIAGNOSIS — N400 Enlarged prostate without lower urinary tract symptoms: Secondary | ICD-10-CM

## 2020-01-19 ENCOUNTER — Other Ambulatory Visit (INDEPENDENT_AMBULATORY_CARE_PROVIDER_SITE_OTHER): Payer: BC Managed Care – PPO

## 2020-01-19 DIAGNOSIS — R7989 Other specified abnormal findings of blood chemistry: Secondary | ICD-10-CM

## 2020-01-19 DIAGNOSIS — N400 Enlarged prostate without lower urinary tract symptoms: Secondary | ICD-10-CM

## 2020-01-19 DIAGNOSIS — R7303 Prediabetes: Secondary | ICD-10-CM

## 2020-01-19 DIAGNOSIS — E785 Hyperlipidemia, unspecified: Secondary | ICD-10-CM

## 2020-01-19 LAB — COMPREHENSIVE METABOLIC PANEL
ALT: 27 U/L (ref 0–53)
AST: 18 U/L (ref 0–37)
Albumin: 4.4 g/dL (ref 3.5–5.2)
Alkaline Phosphatase: 52 U/L (ref 39–117)
BUN: 14 mg/dL (ref 6–23)
CO2: 27 mEq/L (ref 19–32)
Calcium: 9.3 mg/dL (ref 8.4–10.5)
Chloride: 103 mEq/L (ref 96–112)
Creatinine, Ser: 0.93 mg/dL (ref 0.40–1.50)
GFR: 86.52 mL/min (ref >60.00)
Glucose, Bld: 114 mg/dL — ABNORMAL HIGH (ref 70–99)
Potassium: 4.2 mEq/L (ref 3.5–5.1)
Sodium: 137 mEq/L (ref 135–145)
Total Bilirubin: 0.5 mg/dL (ref 0.2–1.2)
Total Protein: 7.1 g/dL (ref 6.0–8.3)

## 2020-01-19 LAB — LIPID PANEL
Cholesterol: 171 mg/dL (ref 0–200)
HDL: 34.1 mg/dL — ABNORMAL LOW (ref >39.00)
LDL Cholesterol: 109 mg/dL — ABNORMAL HIGH (ref 0–99)
NonHDL: 137.03
Total CHOL/HDL Ratio: 5
Triglycerides: 142 mg/dL (ref 0.0–149.0)
VLDL: 28.4 mg/dL (ref 0.0–40.0)

## 2020-01-19 LAB — T4, FREE: Free T4: 0.85 ng/dL (ref 0.60–1.60)

## 2020-01-19 LAB — HEMOGLOBIN A1C: Hgb A1c MFr Bld: 5.9 % (ref 4.6–6.5)

## 2020-01-19 LAB — TSH: TSH: 4.18 u[IU]/mL (ref 0.35–4.50)

## 2020-01-19 LAB — PSA: PSA: 0.4 ng/mL (ref 0.10–4.00)

## 2020-01-26 ENCOUNTER — Other Ambulatory Visit: Payer: Self-pay

## 2020-01-26 ENCOUNTER — Ambulatory Visit (INDEPENDENT_AMBULATORY_CARE_PROVIDER_SITE_OTHER): Payer: BC Managed Care – PPO | Admitting: Family Medicine

## 2020-01-26 ENCOUNTER — Encounter: Payer: Self-pay | Admitting: Family Medicine

## 2020-01-26 VITALS — BP 114/82 | HR 93 | Temp 97.6°F | Ht 69.0 in | Wt 194.6 lb

## 2020-01-26 DIAGNOSIS — Z0000 Encounter for general adult medical examination without abnormal findings: Secondary | ICD-10-CM | POA: Diagnosis not present

## 2020-01-26 DIAGNOSIS — R7989 Other specified abnormal findings of blood chemistry: Secondary | ICD-10-CM

## 2020-01-26 DIAGNOSIS — M25562 Pain in left knee: Secondary | ICD-10-CM

## 2020-01-26 DIAGNOSIS — E785 Hyperlipidemia, unspecified: Secondary | ICD-10-CM | POA: Diagnosis not present

## 2020-01-26 DIAGNOSIS — R7303 Prediabetes: Secondary | ICD-10-CM

## 2020-01-26 DIAGNOSIS — M25561 Pain in right knee: Secondary | ICD-10-CM | POA: Insufficient documentation

## 2020-01-26 DIAGNOSIS — Z1211 Encounter for screening for malignant neoplasm of colon: Secondary | ICD-10-CM | POA: Diagnosis not present

## 2020-01-26 NOTE — Assessment & Plan Note (Signed)
Preventative protocols reviewed and updated unless pt declined. Discussed healthy diet and lifestyle.  

## 2020-01-26 NOTE — Assessment & Plan Note (Addendum)
Benign exam. Acute of 2 wk duration without inciting trauma/injury or fall. Supportive care recommended - ice, heating pad, 1 wk NSAID course, if not better trial vit D, glucosamine or turmeric supplement for joint.

## 2020-01-26 NOTE — Patient Instructions (Addendum)
Pass by lab for stool kit.  Overall knees are looking ok today. Start ibuprofen '400mg'$  twice daily with meals for a week. If no better, then trry supplements for joint health like vitamin D 2000 units daily, glucosamine supplement daily or turmeric anti inflammatory supplement 400-'500mg'$  daily. Let us know if ongoing trouble.  Return as needed or in 1 year for next physical.  Health Maintenance, Male Adopting a healthy lifestyle and getting preventive care are important in promoting health and wellness. Ask your health care provider about:  The right schedule for you to have regular tests and exams.  Things you can do on your own to prevent diseases and keep yourself healthy. What should I know about diet, weight, and exercise? Eat a healthy diet   Eat a diet that includes plenty of vegetables, fruits, low-fat dairy products, and lean protein.  Do not eat a lot of foods that are high in solid fats, added sugars, or sodium. Maintain a healthy weight Body mass index (BMI) is a measurement that can be used to identify possible weight problems. It estimates body fat based on height and weight. Your health care provider can help determine your BMI and help you achieve or maintain a healthy weight. Get regular exercise Get regular exercise. This is one of the most important things you can do for your health. Most adults should:  Exercise for at least 150 minutes each week. The exercise should increase your heart rate and make you sweat (moderate-intensity exercise).  Do strengthening exercises at least twice a week. This is in addition to the moderate-intensity exercise.  Spend less time sitting. Even light physical activity can be beneficial. Watch cholesterol and blood lipids Have your blood tested for lipids and cholesterol at 50 years of age, then have this test every 5 years. You may need to have your cholesterol levels checked more often if:  Your lipid or cholesterol levels are  high.  You are older than 49 years of age.  You are at high risk for heart disease. What should I know about cancer screening? Many types of cancers can be detected early and may often be prevented. Depending on your health history and family history, you may need to have cancer screening at various ages. This may include screening for:  Colorectal cancer.  Prostate cancer.  Skin cancer.  Lung cancer. What should I know about heart disease, diabetes, and high blood pressure? Blood pressure and heart disease  High blood pressure causes heart disease and increases the risk of stroke. This is more likely to develop in people who have high blood pressure readings, are of African descent, or are overweight.  Talk with your health care provider about your target blood pressure readings.  Have your blood pressure checked: ? Every 3-5 years if you are 5-15 years of age. ? Every year if you are 72 years old or older.  If you are between the ages of 56 and 17 and are a current or former smoker, ask your health care provider if you should have a one-time screening for abdominal aortic aneurysm (AAA). Diabetes Have regular diabetes screenings. This checks your fasting blood sugar level. Have the screening done:  Once every three years after age 71 if you are at a normal weight and have a low risk for diabetes.  More often and at a younger age if you are overweight or have a high risk for diabetes. What should I know about preventing infection? Hepatitis B If you  have a higher risk for hepatitis B, you should be screened for this virus. Talk with your health care provider to find out if you are at risk for hepatitis B infection. Hepatitis C Blood testing is recommended for:  Everyone born from [birth year] through 1965.  Anyone with known risk factors for hepatitis C. Sexually transmitted infections (STIs)  You should be screened each year for STIs, including gonorrhea and chlamydia,  if: ? You are sexually active and are younger than 49 years of age. ? You are older than 49 years of age and your health care provider tells you that you are at risk for this type of infection. ? Your sexual activity has changed since you were last screened, and you are at increased risk for chlamydia or gonorrhea. Ask your health care provider if you are at risk.  Ask your health care provider about whether you are at high risk for HIV. Your health care provider may recommend a prescription medicine to help prevent HIV infection. If you choose to take medicine to prevent HIV, you should first get tested for HIV. You should then be tested every 3 months for as long as you are taking the medicine. Follow these instructions at home: Lifestyle  Do not use any products that contain nicotine or tobacco, such as cigarettes, e-cigarettes, and chewing tobacco. If you need help quitting, ask your health care provider.  Do not use street drugs.  Do not share needles.  Ask your health care provider for help if you need support or information about quitting drugs. Alcohol use  Do not drink alcohol if your health care provider tells you not to drink.  If you drink alcohol: ? Limit how much you have to 0-2 drinks a day. ? Be aware of how much alcohol is in your drink. In the [place] one drink equals one 12 oz bottle of beer (355 mL), one 5 oz glass of wine (148 mL), or one 1 oz glass of hard liquor (44 mL). General instructions  Schedule regular health, dental, and eye exams.  Stay current with your vaccines.  Tell your health care provider if: ? You often feel depressed. ? You have ever been abused or do not feel safe at home. Summary  Adopting a healthy lifestyle and getting preventive care are important in promoting health and wellness.  Follow your health care provider's instructions about healthy diet, exercising, and getting tested or screened for diseases.  Follow your health care  provider's instructions on monitoring your cholesterol and blood pressure. This information is not intended to replace advice given to you by your health care provider. Make sure you discuss any questions you have with your health care provider. Document Revised: 07/22/2018 Document Reviewed: 07/22/2018 Elsevier Patient Education  2020 [employer].

## 2020-01-26 NOTE — Progress Notes (Signed)
This visit was conducted in person.  BP 114/82 (BP Location: Left Arm, Patient Position: Sitting, Cuff Size: Normal)   Pulse 93   Temp 97.6 F (36.4 C) (Temporal)   Ht '5\' 9"'  (1.753 m)   Wt 194 lb 9 oz (88.3 kg)   SpO2 98%   BMI 28.73 kg/m    CC: CPE Subjective:    Patient ID: Trevor Hansen, male    DOB: 04/05/1971, 48 y.o.   MRN: [id number]  HPI: Trevor Hansen is a 49 y.o. male presenting on 01/26/2020 for Annual Exam   Continues working from home.  Some bilateral knee pain for past 2-3 wks, more noticeable after prolonged period seated. Has been using warm compresses. OTC analgesic trial didn't help. No locking or instability of knees. H/o R knee surgery for meniscal repair  Preventative: Colon cancer screening - discussed. Would like ifob.  Flu shot yearly Tdap 2013 COVID vaccine - completed [place] 11/2019 Seat belt use discussed Sunscreen use discussed, no changing moles on skin. Non smoker Alcohol - Camera operator - yearly  Eye exam - due   Caffeine: 2 cups tea or coffee/day; minimal soda Lives with wife and 1 daughter (2009); no pets  Occ: 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[employer]  School: [place]; masters in [employer]  Activity: runs on treadmill 30 min 2x/wk - not recently  Diet: good water, daily fruits/vegetables, vegetarian      Relevant past medical, surgical, family and social history reviewed and updated as indicated. Interim medical history since our last visit reviewed. Allergies and medications reviewed and updated. Outpatient Medications Prior to Visit  Medication Sig Dispense Refill  . Multiple Vitamin (MULTIVITAMIN) tablet Take 1 tablet by mouth daily.      . cetirizine (ZYRTEC) 10 MG tablet Take 10 mg by mouth daily as needed for allergies.    . mometasone (NASONEX) 50 MCG/ACT nasal spray One spray each nostril daily as needed. 17 g 6   No facility-administered medications prior to visit.     Per HPI unless specifically indicated in ROS section  below Review of Systems  Constitutional: Negative for activity change, appetite change, chills, fatigue, fever and unexpected weight change.  HENT: Negative for hearing loss.   Eyes: Negative for visual disturbance.  Respiratory: Negative for cough, chest tightness, shortness of breath and wheezing.   Cardiovascular: Negative for chest pain, palpitations and leg swelling.  Gastrointestinal: Negative for abdominal distention, abdominal pain, blood in stool, constipation, diarrhea, nausea and vomiting.  Genitourinary: Negative for difficulty urinating and hematuria.  Musculoskeletal: Negative for arthralgias, myalgias and neck pain.  Skin: Negative for rash.  Neurological: Negative for dizziness, seizures, syncope and headaches.  Hematological: Negative for adenopathy. Does not bruise/bleed easily.  Psychiatric/Behavioral: Negative for dysphoric mood. The patient is not nervous/anxious.    Objective:  BP 114/82 (BP Location: Left Arm, Patient Position: Sitting, Cuff Size: Normal)   Pulse 93   Temp 97.6 F (36.4 C) (Temporal)   Ht '5\' 9"'  (1.753 m)   Wt 194 lb 9 oz (88.3 kg)   SpO2 98%   BMI 28.73 kg/m   Wt Readings from Last 3 Encounters:  01/26/20 194 lb 9 oz (88.3 kg)  02/03/18 189 lb 4 oz (85.8 kg)  07/02/17 189 lb (85.7 kg)      Physical Exam Vitals and nursing note reviewed.  Constitutional:      General: He is not in acute distress.    Appearance: Normal appearance. He is well-developed. He is not ill-appearing.  HENT:  Head: Normocephalic and atraumatic.     Right Ear: Hearing, tympanic membrane, ear canal and external ear normal.     Left Ear: Hearing, tympanic membrane, ear canal and external ear normal.  Eyes:     General: No scleral icterus.    Extraocular Movements: Extraocular movements intact.     Conjunctiva/sclera: Conjunctivae normal.     Pupils: Pupils are equal, round, and reactive to light.  Cardiovascular:     Rate and Rhythm: Normal rate and regular  rhythm.     Pulses: Normal pulses.          Radial pulses are 2+ on the right side and 2+ on the left side.     Heart sounds: Normal heart sounds. No murmur heard.   Pulmonary:     Effort: Pulmonary effort is normal. No respiratory distress.     Breath sounds: Normal breath sounds. No wheezing, rhonchi or rales.  Abdominal:     General: Abdomen is flat. Bowel sounds are normal. There is no distension.     Palpations: Abdomen is soft. There is no mass.     Tenderness: There is no abdominal tenderness. There is no guarding or rebound.     Hernia: No hernia is present.  Musculoskeletal:        General: Normal range of motion.     Cervical back: Normal range of motion and neck supple.     Right lower leg: No edema.     Left lower leg: No edema.     Comments:  Bilateral knee exam: No deformity on inspection. No pain with palpation of knee landmarks. No effusion/swelling noted. FROM in flex/extension without crepitus. No popliteal fullness. Neg drawer test. Neg mcmurray test. No pain with valgus/varus stress. No PFgrind. No abnormal patellar mobility.   Lymphadenopathy:     Cervical: No cervical adenopathy.  Skin:    General: Skin is warm and dry.     Findings: No rash.  Neurological:     General: No focal deficit present.     Mental Status: He is alert and oriented to person, place, and time.     Comments: CN grossly intact, station and gait intact  Psychiatric:        Mood and Affect: Mood normal.        Behavior: Behavior normal.        Thought Content: Thought content normal.        Judgment: Judgment normal.       Results for orders placed or performed in visit on 01/19/20  T4, free  Result Value Ref Range   Free T4 0.85 0.60 - 1.60 ng/dL  Hemoglobin A1c  Result Value Ref Range   Hgb A1c MFr Bld 5.9 4.6 - 6.5 %  PSA  Result Value Ref Range   PSA 0.40 0.10 - 4.00 ng/mL  TSH  Result Value Ref Range   TSH 4.18 0.35 - 4.50 uIU/mL  Comprehensive metabolic panel    Result Value Ref Range   Sodium 137 135 - 145 mEq/L   Potassium 4.2 3.5 - 5.1 mEq/L   Chloride 103 96 - 112 mEq/L   CO2 27 19 - 32 mEq/L   Glucose, Bld 114 (H) 70 - 99 mg/dL   BUN 14 6 - 23 mg/dL   Creatinine, Ser 0.93 0.40 - 1.50 mg/dL   Total Bilirubin 0.5 0.2 - 1.2 mg/dL   Alkaline Phosphatase 52 39 - 117 U/L   AST 18 0 - 37 U/L  ALT 27 0 - 53 U/L   Total Protein 7.1 6.0 - 8.3 g/dL   Albumin 4.4 3.5 - 5.2 g/dL   GFR 86.52 >60.00 mL/min   Calcium 9.3 8.4 - 10.5 mg/dL  Lipid panel  Result Value Ref Range   Cholesterol 171 0 - 200 mg/dL   Triglycerides 142.0 0 - 149 mg/dL   HDL 34.10 (L) >39.00 mg/dL   VLDL 28.4 0.0 - 40.0 mg/dL   LDL Cholesterol 109 (H) 0 - 99 mg/dL   Total CHOL/HDL Ratio 5    NonHDL 137.03    Assessment & Plan:  This visit occurred during the SARS-CoV-2 public health emergency.  Safety protocols were in place, including screening questions prior to the visit, additional usage of staff PPE, and extensive cleaning of exam room while observing appropriate contact time as indicated for disinfecting solutions.   Problem List Items Addressed This Visit    Prediabetes    Aware to avoid added sugars in diet, limit simple carbs.      Health maintenance examination - Primary    Preventative protocols reviewed and updated unless pt declined. Discussed healthy diet and lifestyle.       Dyslipidemia    Chronic, mild off meds. Reviewed healthy diet and lifestyle changes to improve LDL and HDL levels.  The 10-year ASCVD risk score Mikey Bussing DC Brooke Bonito., et al., 2013) is: 2.8%   Values used to calculate the score:     Age: 89 years     Sex: Male     Is Non-Hispanic African American: No     Diabetic: No     Tobacco smoker: No     Systolic Blood Pressure: 833 mmHg     Is BP treated: No     HDL Cholesterol: 34.1 mg/dL     Total Cholesterol: 171 mg/dL       Bilateral knee pain    Benign exam. Acute of 2 wk duration without inciting trauma/injury or fall. Supportive  care recommended - ice, heating pad, 1 wk NSAID course, if not better trial vit D, glucosamine or turmeric supplement for joint.       Abnormal TSH    Stable TFTs.        Other Visit Diagnoses    Special screening for malignant neoplasms, colon       Relevant Orders   Fecal occult blood, imunochemical       No orders of the defined types were placed in this encounter.  Orders Placed This Encounter  Procedures  . Fecal occult blood, imunochemical    Standing Status:   Future    Standing Expiration Date:   01/25/2021    Patient instructions: Pass by lab for stool kit.  Overall knees are looking ok today. Start ibuprofen 468m twice daily with meals for a week. If no better, then trry supplements for joint health like vitamin D 2000 units daily, glucosamine supplement daily or turmeric anti inflammatory supplement 400-5020mdaily. Let usKoreanow if ongoing trouble.  Return as needed or in 1 year for next physical.  Follow up plan: Return in about 1 year (around 01/25/2021) for annual exam, prior fasting for blood work.  JaRia BushMD

## 2020-01-26 NOTE — Assessment & Plan Note (Signed)
Stable TFTs.

## 2020-01-26 NOTE — Assessment & Plan Note (Signed)
Aware to avoid added sugars in diet, limit simple carbs.

## 2020-01-26 NOTE — Assessment & Plan Note (Signed)
Chronic, mild off meds. Reviewed healthy diet and lifestyle changes to improve LDL and HDL levels.  The 10-year ASCVD risk score Mikey Bussing DC Brooke Bonito., et al., 2013) is: 2.8%   Values used to calculate the score:     Age: 49 years     Sex: Male     Is Non-Hispanic African American: No     Diabetic: No     Tobacco smoker: No     Systolic Blood Pressure: 314 mmHg     Is BP treated: No     HDL Cholesterol: 34.1 mg/dL     Total Cholesterol: 171 mg/dL

## 2021-02-16 ENCOUNTER — Telehealth: Payer: Self-pay

## 2021-02-16 NOTE — Telephone Encounter (Signed)
Pt said dry cough started around 01/31/21; pt had positive home covid test on 02/08/21. Pt does not have wheezing, rattling in chest and no CP.pt has not been taking any abx and pt has take OTC nyquil which helps cough slightly. Pt does not have SOB,H/A fever,S/T or other covid symptoms except cough. Pt will go to UC in [place] for eval and any needed testing. Sending note to Dr Baldwin Crown CMA/

## 2021-02-16 NOTE — Telephone Encounter (Signed)
Spoke with pt offering video visit.  Pt declined stating he is already enroute to [facility].  FYI to Dr. Darnell Level.

## 2021-02-16 NOTE — Telephone Encounter (Signed)
Noted. Outside of window for antiviral. Appreciate [facility] seeing pt.

## 2021-06-29 ENCOUNTER — Other Ambulatory Visit: Payer: Self-pay

## 2021-06-29 ENCOUNTER — Ambulatory Visit (INDEPENDENT_AMBULATORY_CARE_PROVIDER_SITE_OTHER): Payer: BC Managed Care – PPO

## 2021-06-29 DIAGNOSIS — Z23 Encounter for immunization: Secondary | ICD-10-CM

## 2021-12-03 ENCOUNTER — Ambulatory Visit (INDEPENDENT_AMBULATORY_CARE_PROVIDER_SITE_OTHER): Payer: BC Managed Care – PPO | Admitting: Family Medicine

## 2021-12-03 ENCOUNTER — Ambulatory Visit: Payer: BC Managed Care – PPO

## 2021-12-03 ENCOUNTER — Telehealth: Payer: Self-pay

## 2021-12-03 ENCOUNTER — Encounter: Payer: Self-pay | Admitting: Family Medicine

## 2021-12-03 VITALS — BP 130/82 | HR 72 | Temp 98.0°F | Ht 69.0 in | Wt 190.5 lb

## 2021-12-03 DIAGNOSIS — R202 Paresthesia of skin: Secondary | ICD-10-CM | POA: Diagnosis not present

## 2021-12-03 DIAGNOSIS — E785 Hyperlipidemia, unspecified: Secondary | ICD-10-CM

## 2021-12-03 DIAGNOSIS — R7303 Prediabetes: Secondary | ICD-10-CM

## 2021-12-03 DIAGNOSIS — E538 Deficiency of other specified B group vitamins: Secondary | ICD-10-CM | POA: Insufficient documentation

## 2021-12-03 LAB — COMPREHENSIVE METABOLIC PANEL
ALT: 19 U/L (ref 0–53)
AST: 17 U/L (ref 0–37)
Albumin: 4.8 g/dL (ref 3.5–5.2)
Alkaline Phosphatase: 49 U/L (ref 39–117)
BUN: 11 mg/dL (ref 6–23)
CO2: 28 mEq/L (ref 19–32)
Calcium: 9.9 mg/dL (ref 8.4–10.5)
Chloride: 100 mEq/L (ref 96–112)
Creatinine, Ser: 0.83 mg/dL (ref 0.40–1.50)
GFR: 102.12 mL/min (ref >60.00)
Glucose, Bld: 100 mg/dL — ABNORMAL HIGH (ref 70–99)
Potassium: 4.3 mEq/L (ref 3.5–5.1)
Sodium: 137 mEq/L (ref 135–145)
Total Bilirubin: 0.5 mg/dL (ref 0.2–1.2)
Total Protein: 7.6 g/dL (ref 6.0–8.3)

## 2021-12-03 LAB — CBC WITH DIFFERENTIAL/PLATELET
Basophils Absolute: 0 10*3/uL (ref 0.0–0.1)
Basophils Relative: 0.7 % (ref 0.0–3.0)
Eosinophils Absolute: 0.1 10*3/uL (ref 0.0–0.7)
Eosinophils Relative: 2.8 % (ref 0.0–5.0)
HCT: 39.7 % (ref 39.0–52.0)
Hemoglobin: 13.6 g/dL (ref 13.0–17.0)
Lymphocytes Relative: 30.2 % (ref 12.0–46.0)
Lymphs Abs: 1.5 10*3/uL (ref 0.7–4.0)
MCHC: 34.4 g/dL (ref 30.0–36.0)
MCV: 87.6 fl (ref 78.0–100.0)
Monocytes Absolute: 0.3 10*3/uL (ref 0.1–1.0)
Monocytes Relative: 6 % (ref 3.0–12.0)
Neutro Abs: 3.1 10*3/uL (ref 1.4–7.7)
Neutrophils Relative %: 60.3 % (ref 43.0–77.0)
Platelets: 272 10*3/uL (ref 150.0–400.0)
RBC: 4.53 Mil/uL (ref 4.22–5.81)
RDW: 13.8 % (ref 11.5–15.5)
WBC: 5.1 10*3/uL (ref 4.0–10.5)

## 2021-12-03 LAB — LIPID PANEL
Cholesterol: 164 mg/dL (ref 0–200)
HDL: 41.2 mg/dL (ref >39.00)
LDL Cholesterol: 96 mg/dL (ref 0–99)
NonHDL: 122.92
Total CHOL/HDL Ratio: 4
Triglycerides: 136 mg/dL (ref 0.0–149.0)
VLDL: 27.2 mg/dL (ref 0.0–40.0)

## 2021-12-03 LAB — VITAMIN B12: Vitamin B-12: 59 pg/mL — ABNORMAL LOW (ref 211–911)

## 2021-12-03 LAB — SEDIMENTATION RATE: Sed Rate: 20 mm/hr (ref 0–20)

## 2021-12-03 LAB — FOLATE: Folate: >23.5 ng/mL (ref >5.9)

## 2021-12-03 LAB — TSH: TSH: 3.18 u[IU]/mL (ref 0.35–5.50)

## 2021-12-03 LAB — HEMOGLOBIN A1C: Hgb A1c MFr Bld: 6.1 % (ref 4.6–6.5)

## 2021-12-03 NOTE — Assessment & Plan Note (Signed)
Update FLP as fasting.  

## 2021-12-03 NOTE — Assessment & Plan Note (Addendum)
New over past 3 days - without numbness, burning pain.  ?Mild weakness to intrinsic mm of L hand on testing, otherwise reassuring exam.  ?Check labwork including vitamin deficiencies, TSH, CBC. Discussed if unrevealing labs and ongoing symptoms, will consider head imaging and refer to neurology for further evaluation, consider NCS. ?No significant risk factors for stroke, no fmhx CVA.  ?Pt agrees with plan.  ?

## 2021-12-03 NOTE — Telephone Encounter (Signed)
I could see today at 12:15pm if pt desires ?

## 2021-12-03 NOTE — Progress Notes (Signed)
? ? Patient ID: Trevor Hansen, male    DOB: [birth year], 51 y.o.   MRN: [id number] ? ?This visit was conducted in person. ? ?BP 130/82   Pulse 72   Temp 98 ?F (36.7 ?C) (Temporal)   Ht '5\' 9"'$  (1.753 m)   Wt 190 lb 8 oz (86.4 kg)   SpO2 98%   BMI 28.13 kg/m?   ? ?CC: L hand paresthesias ?Subjective:  ? ?HPI: ?Trevor Hansen is a 51 y.o. male presenting on 12/03/2021 for Numbness (C/o L hand numbness.  Started  about 3 days ago. ) ? ? ?R handed.  ?3d h/o numbness to left anterior 2nd-4th digits. No burning pain.  ?No noted weakness.  ?He also noted some mild neck discomfort after trip to [place] sleeping with new pillow.  ? ?No slurred speech, mental confusion, vision changes, double vision, blurry vision. ? ?Denies inciting trauma/injury or falls.  ?No fmhx strokes.  ?Drank some tea with milk this morning 8am.  ?   ? ?Relevant past medical, surgical, family and social history reviewed and updated as indicated. Interim medical history since our last visit reviewed. ?Allergies and medications reviewed and updated. ?Outpatient Medications Prior to Visit  ?Medication Sig Dispense Refill  ? Multiple Vitamin (MULTIVITAMIN) tablet Take 1 tablet by mouth daily.      ? ?No facility-administered medications prior to visit.  ?  ? ?Per HPI unless specifically indicated in ROS section below ?Review of Systems ? ?Objective:  ?BP 130/82   Pulse 72   Temp 98 ?F (36.7 ?C) (Temporal)   Ht '5\' 9"'$  (1.753 m)   Wt 190 lb 8 oz (86.4 kg)   SpO2 98%   BMI 28.13 kg/m?   ?Wt Readings from Last 3 Encounters:  ?12/03/21 190 lb 8 oz (86.4 kg)  ?01/26/20 194 lb 9 oz (88.3 kg)  ?02/03/18 189 lb 4 oz (85.8 kg)  ?  ?  ?Physical Exam ?Vitals and nursing note reviewed.  ?Constitutional:   ?   Appearance: Normal appearance.  ?Neck:  ?   Comments:  ?FROM cervical neck ?Neg spurling test ?Neg L'hermitte sign ?Musculoskeletal:     ?   General: Normal range of motion.  ?   Cervical back: Normal range of motion and neck supple. No rigidity.  ?    Right lower leg: No edema.  ?   Left lower leg: No edema.  ?Skin: ?   General: Skin is warm and dry.  ?   Findings: No rash.  ?Neurological:  ?   General: No focal deficit present.  ?   Mental Status: He is alert.  ?   Cranial Nerves: Cranial nerves 2-12 are intact.  ?   Sensory: Sensation is intact. No sensory deficit.  ?   Motor: Motor function is intact. No weakness or tremor.  ?   Coordination: Coordination is intact. Finger-Nose-Finger Test normal.  ?   Gait: Gait is intact.  ?   Deep Tendon Reflexes:  ?   Reflex Scores: ?     Tricep reflexes are 2+ on the right side and 2+ on the left side. ?     Bicep reflexes are 2+ on the right side and 2+ on the left side. ?     Brachioradialis reflexes are 2+ on the right side and 2+ on the left side. ?   Comments:  ?CN 2-12 intact ?FTN intact ?EOMI ?5/5 strength testing BUE ?Grip strength intact ?Neg tinel, phalen, neg tinel at ulnar nerve of elbow ?  Slightly diminished strength to intrinsic muscles of L hand with testing thumb/pinky opposition and finger abduction  ?Psychiatric:     ?   Mood and Affect: Mood normal.     ?   Behavior: Behavior normal.  ? ?   ?Lab Results  ?Component Value Date  ? HGBA1C 5.9 01/19/2020  ?  ?Lab Results  ?Component Value Date  ? CHOL 171 01/19/2020  ? HDL 34.10 (L) 01/19/2020  ? LDLCALC 109 (H) 01/19/2020  ? LDLDIRECT 103.0 08/15/2015  ? TRIG 142.0 01/19/2020  ? CHOLHDL 5 01/19/2020  ? ?Assessment & Plan:  ? ?Problem List Items Addressed This Visit   ? ? Dyslipidemia  ?  Update FLP as fasting. ? ?  ?  ? Relevant Orders  ? Comprehensive metabolic panel  ? Lipid panel  ? Prediabetes  ?  Update A1c.  ? ?  ?  ? Relevant Orders  ? Hemoglobin A1c  ? Left hand paresthesia - Primary  ?  New over past 3 days - without numbness, burning pain.  ?Mild weakness to intrinsic mm of L hand on testing, otherwise reassuring exam.  ?Check labwork including vitamin deficiencies, TSH, CBC. Discussed if unrevealing labs and ongoing symptoms, will consider head  imaging and refer to neurology for further evaluation, consider NCS. ?No significant risk factors for stroke, no fmhx CVA.  ?Pt agrees with plan.  ? ?  ?  ? Relevant Orders  ? Vitamin B12  ? Folate  ? Comprehensive metabolic panel  ? TSH  ? CBC with Differential/Platelet  ? Sedimentation rate  ?  ? ?No orders of the defined types were placed in this encounter. ? ?Orders Placed This Encounter  ?Procedures  ? Vitamin B12  ? Folate  ? Comprehensive metabolic panel  ? TSH  ? Lipid panel  ? CBC with Differential/Platelet  ? Sedimentation rate  ? Hemoglobin A1c  ? ? ? ?Patient instructions: ?Labs today for further evaluation of paresthesias of left hand.  ?Let us know if ongoing, we will refer you to neurologist.  ?Return at your convenience for physical.  ? ?Follow up plan: ?Return if symptoms worsen or fail to improve, for follow up visit. ? ?Ria Bush, MD   ?

## 2021-12-03 NOTE — Telephone Encounter (Signed)
Please see notes below access nurse note; pt already has appt with Dr Darnell Level today. ?

## 2021-12-03 NOTE — Telephone Encounter (Signed)
[facility] - Client ?TELEPHONE ADVICE RECORD ?AccessNurse? ?Patient ?Name: ?Ridott ?Gender: Male ?DOB: 1971/07/09 ?Age: 51 Y 31 M ?Return ?Phone ?Number: ?[phone] ?(Primary), ?[phone] ?(Secondary) ?Address: ?City/ ?State/ ?Zip: ?[place] ? [address] ?Client [facility] - Client ?Client Site [facility] ?Provider Ria Bush - MD ?Contact Type Call ?Who Is Calling Patient / Member / Family / Caregiver ?Call Type Triage / Clinical ?Relationship To Patient Self ?Return Phone Number [phone] (Primary) ?Chief Complaint Numbness ?Reason for Call Symptomatic / Request for Health Information ?Initial Comment Caller states patient has numbness in his hand. ?Translation No ?Nurse Assessment ?Nurse: Windle Guard, RN, Lesa Date/Time Eilene Ghazi Time): 12/03/2021 9:02:11 AM ?Confirm and document reason for call. If ?symptomatic, describe symptoms. ---Caller states he has numbness in his left hand ?Does the patient have any new or worsening ?symptoms? ---Yes ?Will a triage be completed? ---Yes ?Related visit to physician within the last 2 weeks? ---No ?Does the PT have any chronic conditions? (i.e. ?diabetes, asthma, this includes High risk factors for ?pregnancy, etc.) ?---No ?Is this a behavioral health or substance abuse call? ---No ?Guidelines ?Guideline Title Affirmed Question Affirmed Notes Nurse Date/Time (Eastern ?Time) ?Neurologic Deficit [1] Numbness (i.e., ?loss of sensation) of ?the face, arm / hand, ?or leg / foot on one ?side of the body AND ?[2] sudden onset ?AND [3] present now ?Conner, RN, Lesa 12/03/2021 9:02:50 ?AM ?Disp. Time (Eastern ?Time) Disposition Final User ?12/03/2021 9:14:54 AM Call EMS 911 Now Yes Conner, RN, Lesa ?PLEASE NOTE: All timestamps contained within this report are represented as [place] Standard Time. ?CONFIDENTIALTY NOTICE: This fax transmission is intended only for the addressee. It contains  information that is legally privileged, confidential or ?otherwise protected from use or disclosure. If you are not the intended recipient, you are strictly prohibited from reviewing, disclosing, copying using ?or disseminating any of this information or taking any action in reliance on or regarding this information. If you have received this fax in error, please ?notify us immediately by telephone so that we can arrange for its return to [place]. Phone: [phone], Toll-Free: [phone], Fax: [phone] ?Page: 2 of 2 ?Call Id: [id number] ?Caller Disagree/Comply Disagree ?Caller Understands Yes ?PreDisposition Home Care ?Care Advice Given Per Guideline ?CALL EMS 911 NOW: * Immediate medical attention is needed. You need to hang up and call 911 (or an ambulance). CARE ?ADVICE given per Neurologic Deficit (Adult) guideline. ?Referrals ?GO TO FACILITY REFUSE ?

## 2021-12-03 NOTE — Assessment & Plan Note (Signed)
Update A1c ?

## 2021-12-03 NOTE — Telephone Encounter (Signed)
I spoke with pt and he accepted appt 12/03/21 at 12:15 with Dr Darnell Level and pt asked me to cancel appt at [facility] today at 2 pm done. Sending note to Dr Darnell Level. ?

## 2021-12-03 NOTE — Patient Instructions (Addendum)
Labs today for further evaluation of paresthesias of left hand.  ?Let us know if ongoing, we will refer you to neurologist.  ?Return at your convenience for physical.  ? ?Paresthesia ?Paresthesia is an abnormal burning or prickling sensation. It is usually felt in the hands, arms, legs, or feet. However, it may occur in any part of the body. Usually, paresthesia is not painful. It may feel like: ?Tingling or numbness. ?Buzzing. ?Itching. ?Paresthesia may occur without any clear cause, or it may be caused by: ?Breathing too quickly (hyperventilation). ?Pressure on a nerve. ?An underlying medical condition. ?Side effects of a medicine. ?Nutritional deficiencies. ?Exposure to toxic chemicals. ?Most people experience temporary (transient) paresthesia at some time in their lives. For some people, it may be long-lasting (chronic) because of an underlying medical condition. If you have paresthesia that lasts a long time, you need to be evaluated by your health care provider. ?Follow these instructions at home: ?Nutrition ?Eat a healthy diet. This includes: ?Eating foods that are high in fiber, such as beans, whole grains, and fresh fruits and vegetables. ?Limiting foods that are high in fat and processed sugars, such as fried or sweet foods. ? ?Alcohol use ? ?Avoid or limit alcohol. Too much alcohol can cause a vitamin B deficiency, and vitamin B is needed for healthy nerves. ?Do not drink alcohol if: ?Your health care provider tells you not to drink. ?You are pregnant, may be pregnant, or are planning to become pregnant. ?If you drink alcohol: ?Limit how much you have to: ?0-1 drink a day for women. ?0-2 drinks a day for men. ?Know how much alcohol is in your drink. In the [place] one drink equals one 12 oz bottle of beer (355 mL), one 5 oz glass of wine (148 mL), or one 1? oz glass of hard liquor (44 mL). ?General instructions ?Take over-the-counter and prescription medicines only as told by your health care provider. ?Do  not use any products that contain nicotine or tobacco. These products include cigarettes, chewing tobacco, and vaping devices, such as e-cigarettes. If you need help quitting, ask your health care provider. ?If you have diabetes, work closely with your health care provider to keep your blood sugar under control. ?If you have numbness in your feet: ?Check every day for signs of injury or infection. Watch for redness, warmth, and swelling. ?Wear padded socks and comfortable shoes. These help protect your feet. ?Keep all follow-up visits. This is important. ?Contact a health care provider if you: ?Have paresthesia that gets worse or does not go away. ?Have numbness after an injury. ?Have a burning or prickling feeling that gets worse when you walk. ?Have pain, cramps, or dizziness, or you faint. ?Develop a rash. ?Get help right away if you: ?Feel muscle weakness. ?Develop new weakness in an arm or leg. ?Have trouble walking or moving. ?Have problems with speech, understanding, or vision. ?Feel confused. ?Cannot control your bladder or bowel movements. ?These symptoms may be an emergency. Get help right away. Call 911. ?Do not wait to see if the symptoms will go away. ?Do not drive yourself to the hospital. ?Summary ?Paresthesia is an abnormal burning or prickling sensation that is usually felt in the hands, arms, legs, or feet. It may also occur in other parts of the body. ?Paresthesia may occur without any clear cause, or it may be caused by breathing too quickly (hyperventilation), pressure on a nerve, an underlying medical condition, side effects of a medicine, nutritional deficiencies, or exposure to toxic chemicals. ?  If you have paresthesia that lasts a long time, you need to be evaluated by your health care provider. ?This information is not intended to replace advice given to you by your health care provider. Make sure you discuss any questions you have with your health care provider. ?Document Revised:  04/09/2021 Document Reviewed: 04/09/2021 ?Elsevier Patient Education ? [place]. ? ?

## 2021-12-03 NOTE — Telephone Encounter (Signed)
Pt said for 3 days numbness in lt hand; no known injury;no weakness in extremities, no H/A, dizziness,CP or SOB. Pt said has never had this before and was sudden onset. No available appts this morning and pt spoke with Lattie Haw RN at access nurse and disposition was ED. Pt does not want to go to ED but will go to [facility] for eval and I advised if needs imaging [facility] will send pt to ED. Pt voiced understanding and will go to [facility] now. Sending note to Dr Darnell Level and have already spoken with [facility] CMA.will attach access nurse note when available. ?

## 2021-12-12 ENCOUNTER — Ambulatory Visit (INDEPENDENT_AMBULATORY_CARE_PROVIDER_SITE_OTHER): Payer: BC Managed Care – PPO

## 2021-12-12 DIAGNOSIS — E538 Deficiency of other specified B group vitamins: Secondary | ICD-10-CM

## 2021-12-12 MED ORDER — CYANOCOBALAMIN 1000 MCG/ML IJ SOLN
1000.0000 ug | Freq: Once | INTRAMUSCULAR | Status: AC
  Administered 2021-12-12: 1000 ug via INTRAMUSCULAR

## 2021-12-12 NOTE — Progress Notes (Signed)
Per orders of Dr. Ria Bush, injection of B12 given by Barkley Bruns in left deltoid. ?Patient tolerated injection well. Patient made next appt for 5/10. ? ?  ?

## 2021-12-19 ENCOUNTER — Ambulatory Visit (INDEPENDENT_AMBULATORY_CARE_PROVIDER_SITE_OTHER): Payer: BC Managed Care – PPO

## 2021-12-19 DIAGNOSIS — E538 Deficiency of other specified B group vitamins: Secondary | ICD-10-CM | POA: Diagnosis not present

## 2021-12-19 MED ORDER — CYANOCOBALAMIN 1000 MCG/ML IJ SOLN
1000.0000 ug | Freq: Once | INTRAMUSCULAR | Status: AC
  Administered 2021-12-19: 1000 ug via INTRAMUSCULAR

## 2021-12-19 NOTE — Progress Notes (Signed)
Patient presented for B 12 injection given by Javeion Cannedy, CMA to right deltoid, patient voiced no concerns nor showed any signs of distress during injection.  

## 2021-12-26 ENCOUNTER — Ambulatory Visit (INDEPENDENT_AMBULATORY_CARE_PROVIDER_SITE_OTHER): Payer: BC Managed Care – PPO

## 2021-12-26 DIAGNOSIS — E538 Deficiency of other specified B group vitamins: Secondary | ICD-10-CM | POA: Diagnosis not present

## 2021-12-26 MED ORDER — CYANOCOBALAMIN 1000 MCG/ML IJ SOLN
1000.0000 ug | Freq: Once | INTRAMUSCULAR | Status: AC
  Administered 2021-12-26: 1000 ug via INTRAMUSCULAR

## 2021-12-26 NOTE — Progress Notes (Signed)
Patient presented for B 12 injection given by Robin Petrakis, CMA to left deltoid, patient voiced no concerns nor showed any signs of distress during injection.  

## 2022-01-02 ENCOUNTER — Ambulatory Visit (INDEPENDENT_AMBULATORY_CARE_PROVIDER_SITE_OTHER): Payer: BC Managed Care – PPO

## 2022-01-02 DIAGNOSIS — E538 Deficiency of other specified B group vitamins: Secondary | ICD-10-CM

## 2022-01-02 MED ORDER — CYANOCOBALAMIN 1000 MCG/ML IJ SOLN
1000.0000 ug | Freq: Once | INTRAMUSCULAR | Status: AC
  Administered 2022-01-02: 1000 ug via INTRAMUSCULAR

## 2022-01-02 NOTE — Progress Notes (Signed)
Patient presented for B 12 injection given by Vann Okerlund, CMA to right deltoid, patient voiced no concerns nor showed any signs of distress during injection.  

## 2022-08-24 ENCOUNTER — Other Ambulatory Visit: Payer: Self-pay | Admitting: Family Medicine

## 2022-08-24 DIAGNOSIS — R7303 Prediabetes: Secondary | ICD-10-CM

## 2022-08-24 DIAGNOSIS — R7989 Other specified abnormal findings of blood chemistry: Secondary | ICD-10-CM

## 2022-08-24 DIAGNOSIS — E785 Hyperlipidemia, unspecified: Secondary | ICD-10-CM

## 2022-08-24 DIAGNOSIS — E538 Deficiency of other specified B group vitamins: Secondary | ICD-10-CM

## 2022-08-24 DIAGNOSIS — N400 Enlarged prostate without lower urinary tract symptoms: Secondary | ICD-10-CM

## 2022-08-24 DIAGNOSIS — Z1159 Encounter for screening for other viral diseases: Secondary | ICD-10-CM

## 2022-08-28 ENCOUNTER — Other Ambulatory Visit (INDEPENDENT_AMBULATORY_CARE_PROVIDER_SITE_OTHER): Payer: BC Managed Care – PPO

## 2022-08-28 DIAGNOSIS — N400 Enlarged prostate without lower urinary tract symptoms: Secondary | ICD-10-CM | POA: Diagnosis not present

## 2022-08-28 DIAGNOSIS — R7989 Other specified abnormal findings of blood chemistry: Secondary | ICD-10-CM

## 2022-08-28 DIAGNOSIS — E538 Deficiency of other specified B group vitamins: Secondary | ICD-10-CM

## 2022-08-28 DIAGNOSIS — R7303 Prediabetes: Secondary | ICD-10-CM

## 2022-08-28 DIAGNOSIS — E785 Hyperlipidemia, unspecified: Secondary | ICD-10-CM | POA: Diagnosis not present

## 2022-08-28 DIAGNOSIS — Z1159 Encounter for screening for other viral diseases: Secondary | ICD-10-CM

## 2022-08-28 LAB — HEMOGLOBIN A1C: Hgb A1c MFr Bld: 6.2 % (ref 4.6–6.5)

## 2022-08-28 LAB — LIPID PANEL
Cholesterol: 159 mg/dL (ref 0–200)
HDL: 35.4 mg/dL — ABNORMAL LOW (ref >39.00)
LDL Cholesterol: 99 mg/dL (ref 0–99)
NonHDL: 123.33
Total CHOL/HDL Ratio: 4
Triglycerides: 121 mg/dL (ref 0.0–149.0)
VLDL: 24.2 mg/dL (ref 0.0–40.0)

## 2022-08-28 LAB — COMPREHENSIVE METABOLIC PANEL
ALT: 20 U/L (ref 0–53)
AST: 17 U/L (ref 0–37)
Albumin: 4.5 g/dL (ref 3.5–5.2)
Alkaline Phosphatase: 57 U/L (ref 39–117)
BUN: 11 mg/dL (ref 6–23)
CO2: 25 mEq/L (ref 19–32)
Calcium: 9.3 mg/dL (ref 8.4–10.5)
Chloride: 102 mEq/L (ref 96–112)
Creatinine, Ser: 0.93 mg/dL (ref 0.40–1.50)
GFR: 95.31 mL/min (ref >60.00)
Glucose, Bld: 113 mg/dL — ABNORMAL HIGH (ref 70–99)
Potassium: 4.1 mEq/L (ref 3.5–5.1)
Sodium: 136 mEq/L (ref 135–145)
Total Bilirubin: 0.3 mg/dL (ref 0.2–1.2)
Total Protein: 7.2 g/dL (ref 6.0–8.3)

## 2022-08-28 LAB — VITAMIN B12: Vitamin B-12: 100 pg/mL — ABNORMAL LOW (ref 211–911)

## 2022-08-28 LAB — PSA: PSA: 0.51 ng/mL (ref 0.10–4.00)

## 2022-08-28 LAB — TSH: TSH: 3.9 u[IU]/mL (ref 0.35–5.50)

## 2022-08-28 LAB — T4, FREE: Free T4: 0.97 ng/dL (ref 0.60–1.60)

## 2022-08-28 NOTE — Addendum Note (Signed)
Addended by: Ria Bush on: 08/28/2022 06:39 PM   Modules accepted: Orders

## 2022-09-02 LAB — INTRINSIC FACTOR ANTIBODIES: Intrinsic Factor: NEGATIVE

## 2022-09-02 LAB — HEPATITIS C ANTIBODY: Hepatitis C Ab: NONREACTIVE

## 2022-09-04 ENCOUNTER — Encounter: Payer: Self-pay | Admitting: Family Medicine

## 2022-09-04 ENCOUNTER — Ambulatory Visit (INDEPENDENT_AMBULATORY_CARE_PROVIDER_SITE_OTHER): Payer: BC Managed Care – PPO | Admitting: Family Medicine

## 2022-09-04 VITALS — BP 128/84 | HR 100 | Temp 97.6°F | Ht 68.25 in | Wt 193.2 lb

## 2022-09-04 DIAGNOSIS — E538 Deficiency of other specified B group vitamins: Secondary | ICD-10-CM

## 2022-09-04 DIAGNOSIS — R7989 Other specified abnormal findings of blood chemistry: Secondary | ICD-10-CM

## 2022-09-04 DIAGNOSIS — Z1211 Encounter for screening for malignant neoplasm of colon: Secondary | ICD-10-CM | POA: Diagnosis not present

## 2022-09-04 DIAGNOSIS — R7303 Prediabetes: Secondary | ICD-10-CM

## 2022-09-04 DIAGNOSIS — Z0001 Encounter for general adult medical examination with abnormal findings: Secondary | ICD-10-CM | POA: Diagnosis not present

## 2022-09-04 DIAGNOSIS — Z23 Encounter for immunization: Secondary | ICD-10-CM

## 2022-09-04 DIAGNOSIS — R202 Paresthesia of skin: Secondary | ICD-10-CM

## 2022-09-04 DIAGNOSIS — E785 Hyperlipidemia, unspecified: Secondary | ICD-10-CM | POA: Diagnosis not present

## 2022-09-04 MED ORDER — CYANOCOBALAMIN 1000 MCG/ML IJ SOLN: INTRAMUSCULAR | 0 refills | Status: AC

## 2022-09-04 MED ORDER — CYANOCOBALAMIN 1000 MCG/ML IJ SOLN
1000.0000 ug | Freq: Once | INTRAMUSCULAR | Status: AC
  Administered 2022-09-04: 1000 ug via INTRAMUSCULAR

## 2022-09-04 MED ORDER — VITAMIN B-12 1000 MCG PO TABS: 1000.0000 ug | ORAL_TABLET | Freq: Every day | ORAL | Status: AC

## 2022-09-04 NOTE — Assessment & Plan Note (Signed)
Ongoing. Saw ortho dx CTS s/p injection and normal NCS per pt.  Anticipate related to vit B12 deficiency - see below.

## 2022-09-04 NOTE — Assessment & Plan Note (Addendum)
Anticipate dietary related deficiency.  Only received 4 weekly shots back in 12/2021, didn't receive monthly shots Recommend weekly shots x 4 wks then monthly shots x 6 months then retesting (lab visit only).  Recommend start OTC SL B12 1094mg daily.  He states wife can give him shots at home - I asked them to come in 1 week for nurse visit for teaching then we can start B12 shots at home.  Rx sent to pharmacy.  IFAb negative

## 2022-09-04 NOTE — Assessment & Plan Note (Signed)
Chronic, mild with HDL mildly low. Will continue to monitor. The 10-year ASCVD risk score (Arnett DK, et al., 2019) is: 4%   Values used to calculate the score:     Age: 52 years     Sex: Male     Is Non-Hispanic African American: No     Diabetic: No     Tobacco smoker: No     Systolic Blood Pressure: 312 mmHg     Is BP treated: No     HDL Cholesterol: 35.4 mg/dL     Total Cholesterol: 159 mg/dL

## 2022-09-04 NOTE — Assessment & Plan Note (Signed)
TSH, fT4 normal.

## 2022-09-04 NOTE — Assessment & Plan Note (Addendum)
Preventative protocols reviewed and updated unless pt declined. Discussed healthy diet and lifestyle.  Refer for colonoscopy to  GI

## 2022-09-04 NOTE — Assessment & Plan Note (Signed)
Continue to encourage limiting added sugars/sweetened beverages in diet.

## 2022-09-04 NOTE — Progress Notes (Signed)
Patient ID: Trevor Hansen, male    DOB: [birth year], 52 y.o.   MRN: [id number]  This visit was conducted in person.  BP 128/84   Pulse 100   Temp 97.6 F (36.4 C) (Temporal)   Ht 5' 8.25" (1.734 m)   Wt 193 lb 4 oz (87.7 kg)   SpO2 98%   BMI 29.17 kg/m    CC: CPE Subjective:   HPI: Trevor Hansen is a 52 y.o. male presenting on 09/04/2022 for Annual Exam   Found to have vitamin B12 deficiency presenting with L hand paresthesias. Received weekly vitamin B12 replacement x 1 month, but no further injections after this.  IF Ab negative. He does follow a vegetarian diet.  He's taking multivitamin about 3 days a week.   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 Also had steroid injection to wrist without benefit.   Preventative:  Colon cancer screening - discussed. Did not do iFOB last year. Interested in colonoscopy - referred.  Prostate cancer screening - no fmhx prostate cancer. Nocturia x1. PSA.  Lung cancer screen - not eligible  Flu shot yearly COVID vaccine - completed [place] 11/2019 x2, no booster Td 2003, Tdap 2013  Shingrix - today Seat belt use discussed Sunscreen use discussed, no changing moles on skin. 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 Interim medical history since our last visit reviewed. Allergies and medications reviewed and updated. Outpatient Medications Prior to Visit  Medication Sig Dispense Refill   Multiple Vitamin (MULTIVITAMIN) tablet Take 1 tablet by mouth daily.       No facility-administered  medications prior to visit.     Per HPI unless specifically indicated in ROS section below Review of Systems  Constitutional:  Negative for activity change, appetite change, chills, fatigue, fever and unexpected weight change.  HENT:  Negative for hearing loss.   Eyes:  Negative for visual disturbance.  Respiratory:  Positive for cough (mild). Negative for chest tightness, shortness of breath and wheezing.   Cardiovascular:  Negative for chest pain, palpitations and leg swelling.  Gastrointestinal:  Negative for abdominal distention, abdominal pain, blood in stool, constipation, diarrhea, nausea and vomiting.  Genitourinary:  Negative for difficulty urinating and hematuria.  Musculoskeletal:  Negative for arthralgias, myalgias and neck pain.  Skin:  Negative for rash.  Neurological:  Negative for dizziness, seizures, syncope and headaches.  Hematological:  Negative for adenopathy. Does not bruise/bleed easily.  Psychiatric/Behavioral:  Negative for dysphoric mood. The patient is not nervous/anxious.     Objective:  BP 128/84   Pulse 100   Temp 97.6 F (36.4 C) (Temporal)   Ht 5' 8.25" (1.734 m)   Wt 193 lb 4 oz (87.7 kg)   SpO2 98%   BMI 29.17 kg/m   Wt Readings from Last 3 Encounters:  09/04/22 193 lb 4 oz (87.7 kg)  12/03/21 190 lb 8 oz (86.4 kg)  01/26/20 194 lb 9 oz (88.3 kg)      Physical Exam Vitals and nursing note reviewed.  Constitutional:  General: He is not in acute distress.    Appearance: Normal appearance. He is well-developed. He is not ill-appearing.  HENT:     Head: Normocephalic and atraumatic.     Right Ear: Hearing, tympanic membrane, ear canal and external ear normal.     Left Ear: Hearing, tympanic membrane, ear canal and external ear normal.     Nose: Nose normal.     Mouth/Throat:     Mouth: Mucous membranes are moist.     Pharynx: Oropharynx is clear. No oropharyngeal exudate or posterior oropharyngeal erythema.  Eyes:     General: No  scleral icterus.    Extraocular Movements: Extraocular movements intact.     Conjunctiva/sclera: Conjunctivae normal.     Pupils: Pupils are equal, round, and reactive to light.  Neck:     Thyroid: No thyroid mass or thyromegaly.  Cardiovascular:     Rate and Rhythm: Normal rate and regular rhythm.     Pulses: Normal pulses.          Radial pulses are 2+ on the right side and 2+ on the left side.     Heart sounds: Normal heart sounds. No murmur heard. Pulmonary:     Effort: Pulmonary effort is normal. No respiratory distress.     Breath sounds: Normal breath sounds. No wheezing, rhonchi or rales.  Abdominal:     General: Bowel sounds are normal. There is no distension.     Palpations: Abdomen is soft. There is no mass.     Tenderness: There is no abdominal tenderness. There is no guarding or rebound.     Hernia: No hernia is present.  Musculoskeletal:        General: Normal range of motion.     Cervical back: Normal range of motion and neck supple.     Right lower leg: No edema.     Left lower leg: No edema.  Lymphadenopathy:     Cervical: No cervical adenopathy.  Skin:    General: Skin is warm and dry.     Findings: No rash.  Neurological:     General: No focal deficit present.     Mental Status: He is alert and oriented to person, place, and time.  Psychiatric:        Mood and Affect: Mood normal.        Behavior: Behavior normal.        Thought Content: Thought content normal.        Judgment: Judgment normal.       Results for orders placed or performed in visit on 08/28/22  PSA  Result Value Ref Range   PSA 0.51 0.10 - 4.00 ng/mL  Vitamin B12  Result Value Ref Range   Vitamin B-12 100 (L) 211 - 911 pg/mL  Hepatitis C antibody  Result Value Ref Range   Hepatitis C Ab NON-REACTIVE NON-REACTIVE  Hemoglobin A1c  Result Value Ref Range   Hgb A1c MFr Bld 6.2 4.6 - 6.5 %  T4, free  Result Value Ref Range   Free T4 0.97 0.60 - 1.60 ng/dL  TSH  Result Value Ref  Range   TSH 3.90 0.35 - 5.50 uIU/mL  Comprehensive metabolic panel  Result Value Ref Range   Sodium 136 135 - 145 mEq/L   Potassium 4.1 3.5 - 5.1 mEq/L   Chloride 102 96 - 112 mEq/L   CO2 25 19 - 32 mEq/L   Glucose, Bld 113 (H) 70 - 99 mg/dL   BUN 11  6 - 23 mg/dL   Creatinine, Ser 0.93 0.40 - 1.50 mg/dL   Total Bilirubin 0.3 0.2 - 1.2 mg/dL   Alkaline Phosphatase 57 39 - 117 U/L   AST 17 0 - 37 U/L   ALT 20 0 - 53 U/L   Total Protein 7.2 6.0 - 8.3 g/dL   Albumin 4.5 3.5 - 5.2 g/dL   GFR 95.31 >60.00 mL/min   Calcium 9.3 8.4 - 10.5 mg/dL  Lipid panel  Result Value Ref Range   Cholesterol 159 0 - 200 mg/dL   Triglycerides 121.0 0.0 - 149.0 mg/dL   HDL 35.40 (L) >39.00 mg/dL   VLDL 24.2 0.0 - 40.0 mg/dL   LDL Cholesterol 99 0 - 99 mg/dL   Total CHOL/HDL Ratio 4    NonHDL 123.33   Intrinsic Factor Antibodies  Result Value Ref Range   Intrinsic Factor Negative Negative    Assessment & Plan:   Problem List Items Addressed This Visit     Encounter for general adult medical examination with abnormal findings - Primary (Chronic)    Preventative protocols reviewed and updated unless pt declined. Discussed healthy diet and lifestyle.  Refer for colonoscopy to [facility]      Dyslipidemia    Chronic, mild with HDL mildly low. Will continue to monitor. The 10-year ASCVD risk score (Arnett DK, et al., 2019) is: 4%   Values used to calculate the score:     Age: 61 years     Sex: Male     Is Non-Hispanic African American: No     Diabetic: No     Tobacco smoker: No     Systolic Blood Pressure: 295 mmHg     Is BP treated: No     HDL Cholesterol: 35.4 mg/dL     Total Cholesterol: 159 mg/dL       Prediabetes    Continue to encourage limiting added sugars/sweetened beverages in diet.       Left hand paresthesia    Ongoing. Saw ortho dx CTS s/p injection and normal NCS per pt.  Anticipate related to vit B12 deficiency - see below.       Vitamin B12 deficiency     Anticipate dietary related deficiency.  Only received 4 weekly shots back in 12/2021, didn't receive monthly shots Recommend weekly shots x 4 wks then monthly shots x 6 months then retesting (lab visit only).  Recommend start OTC SL B12 1075mg daily.  He states wife can give him shots at home - I asked them to come in 1 week for nurse visit for teaching then we can start B12 shots at home.  Rx sent to pharmacy.  IFAb negative       RESOLVED: Abnormal TSH    TSH, fT4 normal.       Other Visit Diagnoses     Special screening for malignant neoplasms, colon       Relevant Orders   Ambulatory referral to Gastroenterology   Need for shingles vaccine       Relevant Orders   Varicella-zoster vaccine IM (Completed)        Meds ordered this encounter  Medications   cyanocobalamin (VITAMIN B12) 1000 MCG tablet    Sig: Take 1 tablet (1,000 mcg total) by mouth daily.   cyanocobalamin (VITAMIN B12) 1000 MCG/ML injection    Sig: Inject 1 mL (1,000 mcg total) into the muscle once a week for 14 days, THEN 1 mL (1,000 mcg total) every 30 (thirty) days.  Dispense:  8 mL    Refill:  0   cyanocobalamin (VITAMIN B12) injection 1,000 mcg    Orders Placed This Encounter  Procedures   Varicella-zoster vaccine IM   Ambulatory referral to Gastroenterology    Referral Priority:   Routine    Referral Type:   Consultation    Referral Reason:   Specialty Services Required    Number of Visits Requested:   1    Patient Instructions  Shingrix today. Return in 2-6 months for nurse visit for 2nd and final shot.  B12 shot today. Return in a week with wife for nurse visit for b12 teaching then may start shots at home. Recommend b12 shot weekly for 1 month then monthly for 6 months the recheck levels. May also start daily OTC vitamin B12 10108mg dissolvable tablets daily.  Schedule lab visit in 6 months - 1 month after last B12 shot.  Good to see you today  Return as needed or in 1 year for next  physical   Follow up plan: Return in about 1 year (around 09/05/2023) for annual exam, prior fasting for blood work.  JRia Bush MD

## 2022-09-04 NOTE — Patient Instructions (Addendum)
Shingrix today. Return in 2-6 months for nurse visit for 2nd and final shot.  B12 shot today. Return in a week with wife for nurse visit for b12 teaching then may start shots at home. Recommend b12 shot weekly for 1 month then monthly for 6 months the recheck levels. May also start daily OTC vitamin B12 1043mg dissolvable tablets daily.  Schedule lab visit in 6 months - 1 month after last B12 shot.  Good to see you today  Return as needed or in 1 year for next physical

## 2022-09-06 ENCOUNTER — Other Ambulatory Visit: Payer: Self-pay

## 2022-09-06 ENCOUNTER — Telehealth: Payer: Self-pay

## 2022-09-06 DIAGNOSIS — Z1211 Encounter for screening for malignant neoplasm of colon: Secondary | ICD-10-CM

## 2022-09-06 MED ORDER — NA SULFATE-K SULFATE-MG SULF 17.5-3.13-1.6 GM/177ML PO SOLN: 1.0000 | Freq: Once | ORAL | 0 refills | Status: AC

## 2022-09-06 NOTE — Telephone Encounter (Signed)
Gastroenterology Pre-Procedure Review  Request Date: 09/30/22 Requesting Physician: Dr. Vicente Males  PATIENT REVIEW QUESTIONS: The patient responded to the following health history questions as indicated:    1. Are you having any GI issues? no 2. Do you have a personal history of Polyps? no 3. Do you have a family history of Colon Cancer or Polyps? no 4. Diabetes Mellitus? no 5. Joint replacements in the past 12 months?no 6. Major health problems in the past 3 months?no 7. Any artificial heart valves, MVP, or defibrillator?no    MEDICATIONS & ALLERGIES:    Patient reports the following regarding taking any anticoagulation/antiplatelet therapy:   Plavix, Coumadin, Eliquis, Xarelto, Lovenox, Pradaxa, Brilinta, or Effient? no Aspirin? no  Patient confirms/reports the following medications:  Current Outpatient Medications  Medication Sig Dispense Refill   cyanocobalamin (VITAMIN B12) 1000 MCG tablet Take 1 tablet (1,000 mcg total) by mouth daily.     cyanocobalamin (VITAMIN B12) 1000 MCG/ML injection Inject 1 mL (1,000 mcg total) into the muscle once a week for 14 days, THEN 1 mL (1,000 mcg total) every 30 (thirty) days. 8 mL 0   Multiple Vitamin (MULTIVITAMIN) tablet Take 1 tablet by mouth daily.       No current facility-administered medications for this visit.    Patient confirms/reports the following allergies:  No Known Allergies  No orders of the defined types were placed in this encounter.   AUTHORIZATION INFORMATION Primary Insurance: 1D#: Group #:  Secondary Insurance: 1D#: Group #:  SCHEDULE INFORMATION: Date: 09/30/22 Time: Location: [facility]

## 2022-09-11 ENCOUNTER — Ambulatory Visit (INDEPENDENT_AMBULATORY_CARE_PROVIDER_SITE_OTHER): Payer: BC Managed Care – PPO | Admitting: *Deleted

## 2022-09-11 DIAGNOSIS — E538 Deficiency of other specified B group vitamins: Secondary | ICD-10-CM

## 2022-09-11 MED ORDER — CYANOCOBALAMIN 1000 MCG/ML IJ SOLN
1000.0000 ug | Freq: Once | INTRAMUSCULAR | Status: AC
  Administered 2022-09-11: 1000 ug via INTRAMUSCULAR

## 2022-09-11 NOTE — Progress Notes (Signed)
Per orders of Dr. Danise Mina, injection of Vitamin B 12 given by Emelia Salisbury. Patient tolerated injection well.   Patient's wife came to learn how to give her husband the Vitamin B 12 injection because his insurance plan will not cover him getting the shot here at the office. Patient's wife was instructed on how to give the injection and she was able to give it to him. Handout was given to patient and his wife on how to give IM injections. Questions were answered. Patient and his wife stated that they felt comfortable and that she feels that she can give them to him in the future. Patient was advised if his wife wants additional training when his next injection is due to call the office and we will be glad to help her until she feels more comfortable giving them. Advised patient's wife that she should be able to find a video to watch in case she needs it.

## 2022-09-27 ENCOUNTER — Encounter: Payer: Self-pay | Admitting: Gastroenterology

## 2022-09-30 ENCOUNTER — Encounter: Payer: Self-pay | Admitting: Gastroenterology

## 2022-09-30 ENCOUNTER — Encounter
Admission: RE | Disposition: A | Discharge status: Home / Self Care | Payer: Self-pay | Source: Home / Self Care | Attending: Gastroenterology

## 2022-09-30 ENCOUNTER — Ambulatory Visit: Payer: BC Managed Care – PPO | Admitting: Anesthesiology

## 2022-09-30 ENCOUNTER — Ambulatory Visit
Admission: RE | Admit: 2022-09-30 | Discharge: 2022-09-30 | Disposition: A | Discharge status: Home / Self Care | Payer: BC Managed Care – PPO | Attending: Gastroenterology | Admitting: Gastroenterology

## 2022-09-30 DIAGNOSIS — R7303 Prediabetes: Secondary | ICD-10-CM | POA: Insufficient documentation

## 2022-09-30 DIAGNOSIS — Z1211 Encounter for screening for malignant neoplasm of colon: Secondary | ICD-10-CM | POA: Insufficient documentation

## 2022-09-30 DIAGNOSIS — D126 Benign neoplasm of colon, unspecified: Secondary | ICD-10-CM | POA: Diagnosis not present

## 2022-09-30 DIAGNOSIS — Z0000 Encounter for general adult medical examination without abnormal findings: Secondary | ICD-10-CM

## 2022-09-30 HISTORY — PX: COLONOSCOPY WITH PROPOFOL: SHX5780

## 2022-09-30 SURGERY — COLONOSCOPY WITH PROPOFOL
Anesthesia: General

## 2022-09-30 MED ORDER — STERILE WATER FOR IRRIGATION IR SOLN
Status: DC | PRN
  Administered 2022-09-30: 60 mL

## 2022-09-30 MED ORDER — PROPOFOL 10 MG/ML IV BOLUS
INTRAVENOUS | Status: AC
  Filled 2022-09-30: qty 20

## 2022-09-30 MED ORDER — LIDOCAINE HCL (CARDIAC) PF 100 MG/5ML IV SOSY
PREFILLED_SYRINGE | INTRAVENOUS | Status: DC | PRN
  Administered 2022-09-30: 50 mg via INTRAVENOUS

## 2022-09-30 MED ORDER — SODIUM CHLORIDE 0.9 % IV SOLN
INTRAVENOUS | Status: DC
  Administered 2022-09-30: 20 mL/h via INTRAVENOUS

## 2022-09-30 MED ORDER — PROPOFOL 10 MG/ML IV BOLUS
INTRAVENOUS | Status: DC | PRN
  Administered 2022-09-30: 70 mg via INTRAVENOUS
  Administered 2022-09-30: 30 mg via INTRAVENOUS

## 2022-09-30 MED ORDER — PROPOFOL 500 MG/50ML IV EMUL
INTRAVENOUS | Status: DC | PRN
  Administered 2022-09-30: 140 ug/kg/min via INTRAVENOUS

## 2022-09-30 NOTE — Anesthesia Postprocedure Evaluation (Signed)
Anesthesia Post Note  Patient: Trevor Hansen  Procedure(s) Performed: COLONOSCOPY WITH PROPOFOL  Patient location during evaluation: PACU Anesthesia Type: General Level of consciousness: awake and alert, oriented and patient cooperative Pain management: pain level controlled Vital Signs Assessment: post-procedure vital signs reviewed and stable Respiratory status: spontaneous breathing, nonlabored ventilation and respiratory function stable Cardiovascular status: blood pressure returned to baseline and stable Postop Assessment: adequate PO intake Anesthetic complications: no   No notable events documented.   Last Vitals:  Vitals:   09/30/22 0927 09/30/22 0937  BP: 121/67 (!) 137/100  Pulse: 96 85  Resp: 18   Temp:    SpO2: 100% 100%    Last Pain:  Vitals:   09/30/22 0937  TempSrc:   PainSc: 0-No pain                 Darrin Nipper

## 2022-09-30 NOTE — H&P (Signed)
Trevor Bellows, MD [address], [place], [place], [place], [address] [id number], [place], [place], [place], [address] Phone: [phone]  Fax: [phone]  Primary Care Physician:  Ria Bush, MD   Pre-Procedure History & Physical: HPI:  Trevor Hansen is a 52 y.o. male is here for an colonoscopy.   Past Medical History:  Diagnosis Date   Allergic rhinitis    Elevated fasting blood sugar    Hypertriglyceridemia    mild   Idiopathic urticaria    s/p eval with allergist Remus Blake)    Past Surgical History:  Procedure Laterality Date   KNEE ARTHROSCOPY W/ MENISCAL REPAIR Right 2006   no heavy lifting >70lbs since then - work note filled out to this end (01/2016); addended to no climbing per patient (01/2017)    Prior to Admission medications   Medication Sig Start Date End Date Taking? Authorizing Provider  cyanocobalamin (VITAMIN B12) 1000 MCG tablet Take 1 tablet (1,000 mcg total) by mouth daily. 09/04/22  Yes Ria Bush, MD  cyanocobalamin (VITAMIN B12) 1000 MCG/ML injection Inject 1 mL (1,000 mcg total) into the muscle once a week for 14 days, THEN 1 mL (1,000 mcg total) every 30 (thirty) days. 09/04/22 10/18/22 Yes Ria Bush, MD  Multiple Vitamin (MULTIVITAMIN) tablet Take 1 tablet by mouth daily.     Yes [provider]    Allergies as of 09/06/2022   (No Known Allergies)    Family History  Problem Relation Age of Onset   Hyperlipidemia Mother    Coronary artery disease Maternal Aunt    Coronary artery disease Maternal Uncle 62   Cancer Maternal Grandmother        bone cancer   Stroke Neg Hx    Diabetes Neg Hx     Social History   Socioeconomic History   Marital status: Married    Spouse name: Not on file   Number of children: 1   Years of education: Not on file   Highest education level: Not on file  Occupational History   Occupation: Lawyer: [employer]  Tobacco Use   Smoking status: Never    Smokeless tobacco: Never  Substance and Sexual Activity   Alcohol use: Yes    Comment: Social   Drug use: No   Sexual activity: Not on file  Other Topics Concern   Not on file  Social History Narrative   Caffeine: 2 cups tea or coffee/day; minimal soda   Lives with wife and 1 daughter (2009); no pets   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[employer]   School: [place]; masters in [employer]   Activity: occasionally runs on treadmill   Diet: good water, daily fruits/vegetables, vegetarian   Social Determinants of Radio broadcast assistant Strain: Not on file  Food Insecurity: Not on file  Transportation Needs: Not on file  Physical Activity: Not on file  Stress: Not on file  Social Connections: Not on file  Intimate Partner Violence: Not on file    Review of Systems: See HPI, otherwise negative ROS  Physical Exam: BP (!) 124/91   Pulse 85   Temp (!) 97.1 F (36.2 C) (Temporal)   Resp 20   Ht 5' 10"$  (1.778 m)   Wt 84.4 kg   SpO2 100%   BMI 26.69 kg/m  General:   Alert,  pleasant and cooperative in NAD Head:  Normocephalic and atraumatic. Neck:  Supple; no masses or thyromegaly. Lungs:  Clear throughout to  auscultation, normal respiratory effort.    Heart:  +S1, +S2, Regular rate and rhythm, No edema. Abdomen:  Soft, nontender and nondistended. Normal bowel sounds, without guarding, and without rebound.   Neurologic:  Alert and  oriented x4;  grossly normal neurologically.  Impression/Plan: Trevor Hansen is here for an colonoscopy to be performed for Screening colonoscopy average risk   Risks, benefits, limitations, and alternatives regarding  colonoscopy have been reviewed with the patient.  Questions have been answered.  All parties agreeable.   Trevor Bellows, MD  09/30/2022, 9:02 AM

## 2022-09-30 NOTE — Transfer of Care (Signed)
Immediate Anesthesia Transfer of Care Note  Patient: Trevor Hansen  Procedure(s) Performed: COLONOSCOPY WITH PROPOFOL  Patient Location: PACU  Anesthesia Type:General  Level of Consciousness: drowsy  Airway & Oxygen Therapy: Patient Spontanous Breathing  Post-op Assessment: Report given to RN and Post -op Vital signs reviewed and stable  Post vital signs: Reviewed and stable  Last Vitals:  Vitals Value Taken Time  BP 121/67   Temp    Pulse 89   Resp 16   SpO2 100     Last Pain:  Vitals:   09/30/22 0823  TempSrc: Temporal  PainSc: 0-No pain         Complications: No notable events documented.

## 2022-09-30 NOTE — Anesthesia Preprocedure Evaluation (Addendum)
Anesthesia Evaluation  Patient identified by MRN, date of birth, ID band Patient awake    Reviewed: Allergy & Precautions, NPO status , Patient's Chart, lab work & pertinent test results  History of Anesthesia Complications Negative for: history of anesthetic complications  Airway Mallampati: I   Neck ROM: Full    Dental no notable dental hx.    Pulmonary neg pulmonary ROS   Pulmonary exam normal breath sounds clear to auscultation       Cardiovascular Exercise Tolerance: Good negative cardio ROS Normal cardiovascular exam Rhythm:Regular Rate:Normal     Neuro/Psych negative neurological ROS     GI/Hepatic negative GI ROS,,,  Endo/Other  Prediabetes   Renal/GU negative Renal ROS   BPH    Musculoskeletal   Abdominal   Peds  Hematology negative hematology ROS (+)   Anesthesia Other Findings   Reproductive/Obstetrics                             Anesthesia Physical Anesthesia Plan  ASA: 2  Anesthesia Plan: General   Post-op Pain Management:    Induction: Intravenous  PONV Risk Score and Plan: 2 and Propofol infusion, TIVA and Treatment may vary due to age or medical condition  Airway Management Planned: Natural Airway  Additional Equipment:   Intra-op Plan:   Post-operative Plan:   Informed Consent: I have reviewed the patients History and Physical, chart, labs and discussed the procedure including the risks, benefits and alternatives for the proposed anesthesia with the patient or authorized representative who has indicated his/her understanding and acceptance.       Plan Discussed with: CRNA  Anesthesia Plan Comments: (LMA/GETA backup discussed.  Patient consented for risks of anesthesia including but not limited to:  - adverse reactions to medications - damage to eyes, teeth, lips or other oral mucosa - nerve damage due to positioning  - sore throat or  hoarseness - damage to heart, brain, nerves, lungs, other parts of body or loss of life  Informed patient about role of CRNA in peri- and intra-operative care.  Patient voiced understanding.)       Anesthesia Quick Evaluation

## 2022-09-30 NOTE — Anesthesia Procedure Notes (Signed)
Date/Time: 09/30/2022 9:10 AM  Performed by: Lily Peer, Eriel Doyon, CRNAPre-anesthesia Checklist: Patient identified, Emergency Drugs available, Suction available, Patient being monitored and Timeout performed Patient Re-evaluated:Patient Re-evaluated prior to induction Oxygen Delivery Method: Nasal cannula Induction Type: IV induction

## 2022-09-30 NOTE — Op Note (Signed)
[facility] Patient Name: Trevor Hansen Procedure Date: 09/30/2022 9:02 AM MRN: [id number] Account #: [id number] Date of Birth: 1970-10-15 Admit Type: Outpatient Age: 52 Room: [facility] ENDO ROOM 1 Gender: Male Note Status: Finalized Instrument Name: Jasper Riling [id number] Procedure:             Colonoscopy Indications:           Screening for colorectal malignant neoplasm Providers:             Jonathon Bellows MD, MD Referring MD:          Ria Bush (Referring MD) Medicines:             Monitored Anesthesia Care Complications:         No immediate complications. Procedure:             Pre-Anesthesia Assessment:                        - Prior to the procedure, a History and Physical was                         performed, and patient medications, allergies and                         sensitivities were reviewed. The patient's tolerance                         of previous anesthesia was reviewed.                        - The risks and benefits of the procedure and the                         sedation options and risks were discussed with the                         patient. All questions were answered and informed                         consent was obtained.                        - ASA Grade Assessment: II - A patient with mild                         systemic disease.                        After obtaining informed consent, the colonoscope was                         passed under direct vision. Throughout the procedure,                         the patient's blood pressure, pulse, and oxygen                         saturations were monitored continuously. The                         Colonoscope was introduced through  the anus and                         advanced to the the cecum, identified by the                         appendiceal orifice. The colonoscopy was performed                         with ease. The patient tolerated the procedure well.                          The quality of the bowel preparation was excellent.                         The ileocecal valve, appendiceal orifice, and rectum                         were photographed. Findings:      The perianal and digital rectal examinations were normal.      A 5 mm polyp was found in the sigmoid colon. The polyp was sessile. The       polyp was removed with a cold snare. Resection and retrieval were       complete.      The exam was otherwise without abnormality on direct and retroflexion       views. Impression:            - One 5 mm polyp in the sigmoid colon, removed with a                         cold snare. Resected and retrieved.                        - The examination was otherwise normal on direct and                         retroflexion views. Recommendation:        - Discharge patient to home (with escort).                        - Resume previous diet.                        - Continue present medications.                        - Await pathology results.                        - Repeat colonoscopy for surveillance based on                         pathology results. Procedure Code(s):     --- Professional ---                        [phone], Colonoscopy, flexible; with removal of                         tumor(s), polyp(s), or other lesion(s) by snare  technique Diagnosis Code(s):     --- Professional ---                        Z12.11, Encounter for screening for malignant neoplasm                         of colon                        D12.5, Benign neoplasm of sigmoid colon CPT copyright 2022 American Medical Association. All rights reserved. The codes documented in this report are preliminary and upon coder review may  be revised to meet current compliance requirements. Jonathon Bellows, MD Jonathon Bellows MD, MD 09/30/2022 9:25:35 AM This report has been signed electronically. Number of Addenda: 0 Note Initiated On: 09/30/2022 9:02 AM Scope  Withdrawal Time: 0 hours 9 minutes 35 seconds  Total Procedure Duration: 0 hours 11 minutes 16 seconds  Estimated Blood Loss:  Estimated blood loss: none.      [facility]

## 2022-10-01 ENCOUNTER — Encounter: Payer: Self-pay | Admitting: Gastroenterology

## 2022-10-01 LAB — SURGICAL PATHOLOGY

## 2022-10-02 ENCOUNTER — Encounter: Payer: Self-pay | Admitting: Family Medicine

## 2022-11-26 ENCOUNTER — Ambulatory Visit (INDEPENDENT_AMBULATORY_CARE_PROVIDER_SITE_OTHER): Payer: BC Managed Care – PPO | Admitting: *Deleted

## 2022-11-26 DIAGNOSIS — Z23 Encounter for immunization: Secondary | ICD-10-CM

## 2022-11-26 NOTE — Progress Notes (Signed)
Per orders of Dr. Gutierrez, injection of shingles vaccine given by Lounell Schumacher. Patient tolerated injection well.  

## 2024-02-11 ENCOUNTER — Ambulatory Visit (INDEPENDENT_AMBULATORY_CARE_PROVIDER_SITE_OTHER): Admitting: Family Medicine

## 2024-02-11 VITALS — BP 124/80 | HR 90 | Temp 98.6°F | Ht 68.5 in | Wt 185.4 lb

## 2024-02-11 DIAGNOSIS — R7303 Prediabetes: Secondary | ICD-10-CM | POA: Diagnosis not present

## 2024-02-11 DIAGNOSIS — E785 Hyperlipidemia, unspecified: Secondary | ICD-10-CM

## 2024-02-11 DIAGNOSIS — E538 Deficiency of other specified B group vitamins: Secondary | ICD-10-CM | POA: Diagnosis not present

## 2024-02-11 DIAGNOSIS — Z0000 Encounter for general adult medical examination without abnormal findings: Secondary | ICD-10-CM | POA: Diagnosis not present

## 2024-02-11 DIAGNOSIS — N400 Enlarged prostate without lower urinary tract symptoms: Secondary | ICD-10-CM

## 2024-02-11 DIAGNOSIS — Z23 Encounter for immunization: Secondary | ICD-10-CM

## 2024-02-11 DIAGNOSIS — B079 Viral wart, unspecified: Secondary | ICD-10-CM | POA: Insufficient documentation

## 2024-02-11 NOTE — Assessment & Plan Note (Signed)
 Preventative protocols reviewed and updated unless pt declined. Discussed healthy diet and lifestyle.

## 2024-02-11 NOTE — Assessment & Plan Note (Signed)
Update PSA 

## 2024-02-11 NOTE — Patient Instructions (Addendum)
 Labs today  Use salicylic acid over the counter treatments (ie Compound W) may use duct tape to increase potency of treatment. If ineffective, return for in office treatment.  Good to see you today Return as needed or in 1 year for next physical  [facility] diabetes prevention program through [facility] - you can sign up online if interested

## 2024-02-11 NOTE — Assessment & Plan Note (Signed)
 Update A1c.  Encouraged limiting added sugar/simple carbs.  Info provided for local [employer] DPP program

## 2024-02-11 NOTE — Progress Notes (Signed)
 Ph: [phone] Fax: [phone]   Patient ID: Trevor Hansen, male    DOB: June 29, 1971, 53 y.o.   MRN: [id number]  This visit was conducted in person.  BP 124/80   Pulse 90   Temp 98.6 F (37 C) (Oral)   Ht 5' 8.5 (1.74 m)   Wt 185 lb 6 oz (84.1 kg)   SpO2 97%   BMI 27.78 kg/m    CC: CPE Subjective:   HPI: Trevor Hansen is a 53 y.o. male presenting on 02/11/2024 for Annual Exam   Vit B12 deficiency - continues b12 daily as well as MVI.  He did complete B12 shots monthly x~6 months IF Ab previously negative.  He does follow a vegetarian diet.  He's taking multivitamin about 3 days a week.   New wart to R hand popped up a few months ago.   Preventative:  COLONOSCOPY WITH PROPOFOL  09/30/2022 - 1 polyp, rpt 7 yrs Romero, Ruel, MD) Prostate cancer screening - no fmhx prostate cancer. Nocturia x1. PSA.  Lung cancer screen - not eligible  Flu shot yearly COVID vaccine - completed Pfizer 11/2019 x2, no booster Td 2003, Tdap 2013, update Tdap today Shingrix  - 08/2022, 11/2022 Seat belt use discussed Sunscreen use discussed, no changing moles on skin.  Sleep - averaging 6-8 hours/night Non smoker Alcohol - social Dentist - yearly  Eye exam - yearly  Caffeine: 2 cups tea or coffee/day; minimal soda  Lives with wife and 1 daughter (2009); no pets  Occ: Sport and exercise psychologist [employer]  School: [place]; masters in [employer]  Activity: walking 30 min/day  Diet: good water , daily fruits/vegetables, vegetarian      Relevant past medical, surgical, family and social history reviewed and updated as indicated. Interim medical history since our last visit reviewed. Allergies and medications reviewed and updated. Outpatient Medications Prior to Visit  Medication Sig Dispense Refill   cyanocobalamin  (VITAMIN B12) 1000 MCG tablet Take 1 tablet (1,000 mcg total) by mouth daily.     Multiple Vitamin (MULTIVITAMIN) tablet Take 1 tablet by mouth daily.       No  facility-administered medications prior to visit.     Per HPI unless specifically indicated in ROS section below Review of Systems  Constitutional:  Negative for activity change, appetite change, chills, fatigue, fever and unexpected weight change.  HENT:  Negative for hearing loss.   Eyes:  Negative for visual disturbance.  Respiratory:  Positive for cough (occ). Negative for chest tightness, shortness of breath and wheezing.   Cardiovascular:  Negative for chest pain, palpitations and leg swelling.  Gastrointestinal:  Negative for abdominal distention, abdominal pain, blood in stool, constipation, diarrhea, nausea and vomiting.  Genitourinary:  Negative for difficulty urinating and hematuria.  Musculoskeletal:  Negative for arthralgias, myalgias and neck pain.  Skin:  Negative for rash.  Neurological:  Negative for dizziness, seizures, syncope and headaches.  Hematological:  Negative for adenopathy. Does not bruise/bleed easily.  Psychiatric/Behavioral:  Negative for dysphoric mood. The patient is not nervous/anxious.     Objective:  BP 124/80   Pulse 90   Temp 98.6 F (37 C) (Oral)   Ht 5' 8.5 (1.74 m)   Wt 185 lb 6 oz (84.1 kg)   SpO2 97%   BMI 27.78 kg/m   Wt Readings from Last 3 Encounters:  02/11/24 185 lb 6 oz (84.1 kg)  09/30/22 186 lb (84.4 kg)  09/04/22 193 lb 4 oz (87.7 kg)      Physical Exam Vitals  and nursing note reviewed.  Constitutional:      General: He is not in acute distress.    Appearance: Normal appearance. He is well-developed. He is not ill-appearing.  HENT:     Head: Normocephalic and atraumatic.     Right Ear: Hearing, tympanic membrane, ear canal and external ear normal.     Left Ear: Hearing, tympanic membrane, ear canal and external ear normal.     Mouth/Throat:     Mouth: Mucous membranes are moist.     Pharynx: Oropharynx is clear. No oropharyngeal exudate or posterior oropharyngeal erythema.  Eyes:     General: No scleral icterus.     Extraocular Movements: Extraocular movements intact.     Conjunctiva/sclera: Conjunctivae normal.     Pupils: Pupils are equal, round, and reactive to light.  Neck:     Thyroid : No thyroid  mass or thyromegaly.  Cardiovascular:     Rate and Rhythm: Normal rate and regular rhythm.     Pulses: Normal pulses.          Radial pulses are 2+ on the right side and 2+ on the left side.     Heart sounds: Normal heart sounds. No murmur heard. Pulmonary:     Effort: Pulmonary effort is normal. No respiratory distress.     Breath sounds: Normal breath sounds. No wheezing, rhonchi or rales.  Abdominal:     General: Bowel sounds are normal. There is no distension.     Palpations: Abdomen is soft. There is no mass.     Tenderness: There is no abdominal tenderness. There is no guarding or rebound.     Hernia: No hernia is present.  Musculoskeletal:        General: Normal range of motion.     Cervical back: Normal range of motion and neck supple.     Right lower leg: No edema.     Left lower leg: No edema.  Lymphadenopathy:     Cervical: No cervical adenopathy.  Skin:    General: Skin is warm and dry.     Findings: Lesion present. No rash.     Comments: Verrucous growth mildly tender to R palmar thumb just distal to IPJ  Neurological:     General: No focal deficit present.     Mental Status: He is alert and oriented to person, place, and time.  Psychiatric:        Mood and Affect: Mood normal.        Behavior: Behavior normal.        Thought Content: Thought content normal.        Judgment: Judgment normal.       Lab Results  Component Value Date   HGBA1C 6.2 08/28/2022    Lab Results  Component Value Date   CHOL 159 08/28/2022   HDL 35.40 (L) 08/28/2022   LDLCALC 99 08/28/2022   LDLDIRECT 103.0 08/15/2015   TRIG 121.0 08/28/2022   CHOLHDL 4 08/28/2022    Lab Results  Component Value Date   TSH 3.90 08/28/2022    Lab Results  Component Value Date   NA 136 08/28/2022   CL 102  08/28/2022   K 4.1 08/28/2022   CO2 25 08/28/2022   BUN 11 08/28/2022   CREATININE 0.93 08/28/2022   GFR 95.31 08/28/2022   CALCIUM 9.3 08/28/2022   ALBUMIN 4.5 08/28/2022   GLUCOSE 113 (H) 08/28/2022    Assessment & Plan:   Problem List Items Addressed This Visit  Health maintenance examination - Primary (Chronic)   Preventative protocols reviewed and updated unless pt declined. Discussed healthy diet and lifestyle.       Dyslipidemia   Chronic, off med. Update FLP. The 10-year ASCVD risk score (Arnett DK, et al., 2019) is: 4.2%   Values used to calculate the score:     Age: 45 years     Clincally relevant sex: Male     Is Non-Hispanic African American: No     Diabetic: No     Tobacco smoker: No     Systolic Blood Pressure: 124 mmHg     Is BP treated: No     HDL Cholesterol: 35.4 mg/dL     Total Cholesterol: 159 mg/dL       Relevant Orders   Lipid panel   Comprehensive metabolic panel with GFR   TSH   Prediabetes   Update A1c.  Encouraged limiting added sugar/simple carbs.  Info provided for local [facility] DPP program      Relevant Orders   Hemoglobin A1c   Benign prostatic hyperplasia   Update PSA.       Relevant Orders   PSA   Vitamin B12 deficiency   Update levels on regular oral b12 replacement in vegetarian diet.       Relevant Orders   Vitamin B12   Verruca vulgaris   To R thumb - OTC treatment with salicylic acid reviewed. If ineffective, return for cryotherapy.       Other Visit Diagnoses       Need for Tdap vaccination       Relevant Orders   Tdap vaccine greater than or equal to 7yo IM (Completed)        No orders of the defined types were placed in this encounter.   Orders Placed This Encounter  Procedures   Tdap vaccine greater than or equal to 7yo IM   Lipid panel   Comprehensive metabolic panel with GFR   Hemoglobin A1c   PSA   Vitamin B12   TSH    Patient Instructions  Labs today  Use salicylic acid over the  counter treatments (ie Compound W) may use duct tape to increase potency of treatment. If ineffective, return for in office treatment.  Good to see you today Return as needed or in 1 year for next physical  [facility] diabetes prevention program through [facility] - you can sign up online if interested  Follow up plan: Return in about 1 year (around 02/10/2025) for annual exam, prior fasting for blood work.  Anton Blas, MD

## 2024-02-11 NOTE — Assessment & Plan Note (Addendum)
 To R thumb - OTC treatment with salicylic acid reviewed. If ineffective, return for cryotherapy.

## 2024-02-11 NOTE — Assessment & Plan Note (Signed)
 Update levels on regular oral b12 replacement in vegetarian diet.

## 2024-02-11 NOTE — Assessment & Plan Note (Addendum)
 Chronic, off med. Update FLP. The 10-year ASCVD risk score (Arnett DK, et al., 2019) is: 4.2%   Values used to calculate the score:     Age: 53 years     Clincally relevant sex: Male     Is Non-Hispanic African American: No     Diabetic: No     Tobacco smoker: No     Systolic Blood Pressure: 124 mmHg     Is BP treated: No     HDL Cholesterol: 35.4 mg/dL     Total Cholesterol: 159 mg/dL

## 2024-02-12 ENCOUNTER — Ambulatory Visit: Payer: Self-pay | Admitting: Family Medicine

## 2024-02-12 LAB — LIPID PANEL
Cholesterol: 168 mg/dL (ref 0–200)
HDL: 38.1 mg/dL — ABNORMAL LOW (ref >39.00)
LDL Cholesterol: 93 mg/dL (ref 0–99)
NonHDL: 129.52
Total CHOL/HDL Ratio: 4
Triglycerides: 185 mg/dL — ABNORMAL HIGH (ref 0.0–149.0)
VLDL: 37 mg/dL (ref 0.0–40.0)

## 2024-02-12 LAB — COMPREHENSIVE METABOLIC PANEL WITH GFR
ALT: 17 U/L (ref 0–53)
AST: 16 U/L (ref 0–37)
Albumin: 4.5 g/dL (ref 3.5–5.2)
Alkaline Phosphatase: 50 U/L (ref 39–117)
BUN: 12 mg/dL (ref 6–23)
CO2: 28 meq/L (ref 19–32)
Calcium: 9.4 mg/dL (ref 8.4–10.5)
Chloride: 102 meq/L (ref 96–112)
Creatinine, Ser: 0.9 mg/dL (ref 0.40–1.50)
GFR: 98.13 mL/min (ref >60.00)
Glucose, Bld: 114 mg/dL — ABNORMAL HIGH (ref 70–99)
Potassium: 4 meq/L (ref 3.5–5.1)
Sodium: 137 meq/L (ref 135–145)
Total Bilirubin: 0.3 mg/dL (ref 0.2–1.2)
Total Protein: 7.2 g/dL (ref 6.0–8.3)

## 2024-02-12 LAB — HEMOGLOBIN A1C: Hgb A1c MFr Bld: 6.1 % (ref 4.6–6.5)

## 2024-02-12 LAB — VITAMIN B12: Vitamin B-12: 346 pg/mL (ref 211–911)

## 2024-02-12 LAB — PSA: PSA: 0.44 ng/mL (ref 0.10–4.00)

## 2024-02-12 LAB — TSH: TSH: 2.31 u[IU]/mL (ref 0.35–5.50)

## 2025-02-07 ENCOUNTER — Other Ambulatory Visit

## 2025-02-14 ENCOUNTER — Encounter: Admitting: Family Medicine
